# Patient Record
Sex: Female | Born: 1975 | Race: White | Hispanic: No | Marital: Married | Smoking: Current every day smoker
Health system: Southern US, Community
[De-identification: ages and names within clinical notes are randomized; demographics above are authoritative.]

## PROBLEM LIST (undated history)

## (undated) DIAGNOSIS — F319 Bipolar disorder, unspecified: Secondary | ICD-10-CM

## (undated) DIAGNOSIS — K219 Gastro-esophageal reflux disease without esophagitis: Secondary | ICD-10-CM

## (undated) DIAGNOSIS — I5042 Chronic combined systolic (congestive) and diastolic (congestive) heart failure: Secondary | ICD-10-CM

## (undated) DIAGNOSIS — F1120 Opioid dependence, uncomplicated: Secondary | ICD-10-CM

## (undated) DIAGNOSIS — G894 Chronic pain syndrome: Secondary | ICD-10-CM

## (undated) DIAGNOSIS — I236 Thrombosis of atrium, auricular appendage, and ventricle as current complications following acute myocardial infarction: Secondary | ICD-10-CM

## (undated) DIAGNOSIS — B182 Chronic viral hepatitis C: Secondary | ICD-10-CM

## (undated) DIAGNOSIS — I10 Essential (primary) hypertension: Secondary | ICD-10-CM

## (undated) HISTORY — PX: UPPER GI ENDOSCOPY: SHX6162

---

## 2018-08-06 DIAGNOSIS — K5090 Crohn's disease, unspecified, without complications: Secondary | ICD-10-CM

## 2018-08-06 HISTORY — DX: Crohn's disease, unspecified, without complications: K50.90

## 2018-10-06 HISTORY — PX: COLONOSCOPY: SHX174

## 2019-04-06 HISTORY — PX: LAPAROSCOPIC RIGHT COLECTOMY: SHX5925

## 2019-08-31 ENCOUNTER — Other Ambulatory Visit: Payer: Self-pay

## 2019-08-31 ENCOUNTER — Encounter (HOSPITAL_COMMUNITY): Payer: Self-pay | Admitting: Internal Medicine

## 2019-08-31 ENCOUNTER — Inpatient Hospital Stay (HOSPITAL_COMMUNITY): Payer: BC Managed Care – PPO

## 2019-08-31 ENCOUNTER — Inpatient Hospital Stay (HOSPITAL_COMMUNITY)
Admission: AD | Admit: 2019-08-31 | Discharge: 2019-09-02 | DRG: 386 | Disposition: A | Discharge status: Home / Self Care | Payer: BC Managed Care – PPO | Source: Other Acute Inpatient Hospital | Attending: Internal Medicine | Admitting: Internal Medicine

## 2019-08-31 DIAGNOSIS — I5042 Chronic combined systolic (congestive) and diastolic (congestive) heart failure: Secondary | ICD-10-CM | POA: Diagnosis present

## 2019-08-31 DIAGNOSIS — K56609 Unspecified intestinal obstruction, unspecified as to partial versus complete obstruction: Secondary | ICD-10-CM | POA: Diagnosis present

## 2019-08-31 DIAGNOSIS — I110 Hypertensive heart disease with heart failure: Secondary | ICD-10-CM | POA: Diagnosis present

## 2019-08-31 DIAGNOSIS — F1120 Opioid dependence, uncomplicated: Secondary | ICD-10-CM | POA: Diagnosis present

## 2019-08-31 DIAGNOSIS — I252 Old myocardial infarction: Secondary | ICD-10-CM

## 2019-08-31 DIAGNOSIS — Z20828 Contact with and (suspected) exposure to other viral communicable diseases: Secondary | ICD-10-CM | POA: Diagnosis present

## 2019-08-31 DIAGNOSIS — Z800 Family history of malignant neoplasm of digestive organs: Secondary | ICD-10-CM

## 2019-08-31 DIAGNOSIS — D250 Submucous leiomyoma of uterus: Secondary | ICD-10-CM | POA: Diagnosis present

## 2019-08-31 DIAGNOSIS — K50812 Crohn's disease of both small and large intestine with intestinal obstruction: Principal | ICD-10-CM | POA: Diagnosis present

## 2019-08-31 DIAGNOSIS — Z765 Malingerer [conscious simulation]: Secondary | ICD-10-CM

## 2019-08-31 DIAGNOSIS — B182 Chronic viral hepatitis C: Secondary | ICD-10-CM | POA: Diagnosis present

## 2019-08-31 DIAGNOSIS — I82890 Acute embolism and thrombosis of other specified veins: Secondary | ICD-10-CM | POA: Diagnosis present

## 2019-08-31 DIAGNOSIS — I10 Essential (primary) hypertension: Secondary | ICD-10-CM | POA: Diagnosis not present

## 2019-08-31 DIAGNOSIS — G8929 Other chronic pain: Secondary | ICD-10-CM | POA: Diagnosis present

## 2019-08-31 DIAGNOSIS — I236 Thrombosis of atrium, auricular appendage, and ventricle as current complications following acute myocardial infarction: Secondary | ICD-10-CM | POA: Diagnosis not present

## 2019-08-31 DIAGNOSIS — M545 Low back pain: Secondary | ICD-10-CM | POA: Diagnosis present

## 2019-08-31 DIAGNOSIS — F319 Bipolar disorder, unspecified: Secondary | ICD-10-CM | POA: Diagnosis present

## 2019-08-31 DIAGNOSIS — G894 Chronic pain syndrome: Secondary | ICD-10-CM | POA: Diagnosis not present

## 2019-08-31 DIAGNOSIS — F17210 Nicotine dependence, cigarettes, uncomplicated: Secondary | ICD-10-CM | POA: Diagnosis present

## 2019-08-31 DIAGNOSIS — K5010 Crohn's disease of large intestine without complications: Secondary | ICD-10-CM | POA: Diagnosis present

## 2019-08-31 DIAGNOSIS — K219 Gastro-esophageal reflux disease without esophagitis: Secondary | ICD-10-CM | POA: Diagnosis present

## 2019-08-31 DIAGNOSIS — K50112 Crohn's disease of large intestine with intestinal obstruction: Secondary | ICD-10-CM | POA: Diagnosis not present

## 2019-08-31 DIAGNOSIS — K7689 Other specified diseases of liver: Secondary | ICD-10-CM | POA: Diagnosis present

## 2019-08-31 DIAGNOSIS — Z8719 Personal history of other diseases of the digestive system: Secondary | ICD-10-CM | POA: Diagnosis not present

## 2019-08-31 DIAGNOSIS — D509 Iron deficiency anemia, unspecified: Secondary | ICD-10-CM | POA: Diagnosis present

## 2019-08-31 DIAGNOSIS — Z9049 Acquired absence of other specified parts of digestive tract: Secondary | ICD-10-CM

## 2019-08-31 DIAGNOSIS — Z79899 Other long term (current) drug therapy: Secondary | ICD-10-CM

## 2019-08-31 DIAGNOSIS — D500 Iron deficiency anemia secondary to blood loss (chronic): Secondary | ICD-10-CM

## 2019-08-31 DIAGNOSIS — K50819 Crohn's disease of both small and large intestine with unspecified complications: Secondary | ICD-10-CM | POA: Diagnosis not present

## 2019-08-31 DIAGNOSIS — K56600 Partial intestinal obstruction, unspecified as to cause: Secondary | ICD-10-CM | POA: Diagnosis not present

## 2019-08-31 HISTORY — DX: Chronic viral hepatitis C: B18.2

## 2019-08-31 HISTORY — DX: Chronic combined systolic (congestive) and diastolic (congestive) heart failure: I50.42

## 2019-08-31 HISTORY — DX: Essential (primary) hypertension: I10

## 2019-08-31 HISTORY — DX: Thrombosis of atrium, auricular appendage, and ventricle as current complications following acute myocardial infarction: I23.6

## 2019-08-31 HISTORY — DX: Gastro-esophageal reflux disease without esophagitis: K21.9

## 2019-08-31 HISTORY — DX: Bipolar disorder, unspecified: F31.9

## 2019-08-31 HISTORY — DX: Chronic pain syndrome: G89.4

## 2019-08-31 HISTORY — DX: Opioid dependence, uncomplicated: F11.20

## 2019-08-31 LAB — CBC WITH DIFFERENTIAL/PLATELET
Abs Immature Granulocytes: 0.01 10*3/uL (ref 0.00–0.07)
Basophils Absolute: 0 10*3/uL (ref 0.0–0.1)
Basophils Relative: 0 %
Eosinophils Absolute: 0 10*3/uL (ref 0.0–0.5)
Eosinophils Relative: 0 %
HCT: 22.3 % — ABNORMAL LOW (ref 36.0–46.0)
Hemoglobin: 6.7 g/dL — CL (ref 12.0–15.0)
Immature Granulocytes: 0 %
Lymphocytes Relative: 39 %
Lymphs Abs: 1.9 10*3/uL (ref 0.7–4.0)
MCH: 22.9 pg — ABNORMAL LOW (ref 26.0–34.0)
MCHC: 30 g/dL (ref 30.0–36.0)
MCV: 76.1 fL — ABNORMAL LOW (ref 80.0–100.0)
Monocytes Absolute: 0.4 10*3/uL (ref 0.1–1.0)
Monocytes Relative: 9 %
Neutro Abs: 2.4 10*3/uL (ref 1.7–7.7)
Neutrophils Relative %: 52 %
Platelets: 311 10*3/uL (ref 150–400)
RBC: 2.93 MIL/uL — ABNORMAL LOW (ref 3.87–5.11)
RDW: 20.4 % — ABNORMAL HIGH (ref 11.5–15.5)
WBC: 4.8 10*3/uL (ref 4.0–10.5)
nRBC: 0 % (ref 0.0–0.2)

## 2019-08-31 LAB — ABO/RH: ABO/RH(D): A POS

## 2019-08-31 LAB — COMPREHENSIVE METABOLIC PANEL
ALT: 11 U/L (ref 0–44)
AST: 21 U/L (ref 15–41)
Albumin: 1.3 g/dL — ABNORMAL LOW (ref 3.5–5.0)
Alkaline Phosphatase: 103 U/L (ref 38–126)
Anion gap: 7 (ref 5–15)
BUN: 9 mg/dL (ref 6–20)
CO2: 21 mmol/L — ABNORMAL LOW (ref 22–32)
Calcium: 7.7 mg/dL — ABNORMAL LOW (ref 8.9–10.3)
Chloride: 111 mmol/L (ref 98–111)
Creatinine, Ser: 0.79 mg/dL (ref 0.44–1.00)
GFR calc Af Amer: >60 mL/min (ref >60)
GFR calc non Af Amer: >60 mL/min (ref >60)
Glucose, Bld: 99 mg/dL (ref 70–99)
Potassium: 3.6 mmol/L (ref 3.5–5.1)
Sodium: 139 mmol/L (ref 135–145)
Total Bilirubin: 0.4 mg/dL (ref 0.3–1.2)
Total Protein: 4.2 g/dL — ABNORMAL LOW (ref 6.5–8.1)

## 2019-08-31 LAB — HEMOGLOBIN AND HEMATOCRIT, BLOOD
HCT: 25.8 % — ABNORMAL LOW (ref 36.0–46.0)
Hemoglobin: 7.9 g/dL — ABNORMAL LOW (ref 12.0–15.0)

## 2019-08-31 LAB — PREPARE RBC (CROSSMATCH)

## 2019-08-31 LAB — RAPID URINE DRUG SCREEN, HOSP PERFORMED
Amphetamines: NOT DETECTED
Barbiturates: NOT DETECTED
Benzodiazepines: POSITIVE — AB
Cocaine: NOT DETECTED
Opiates: POSITIVE — AB
Tetrahydrocannabinol: NOT DETECTED

## 2019-08-31 LAB — HIV ANTIBODY (ROUTINE TESTING W REFLEX): HIV Screen 4th Generation wRfx: NONREACTIVE

## 2019-08-31 LAB — PROTIME-INR
INR: 1.4 — ABNORMAL HIGH (ref 0.8–1.2)
Prothrombin Time: 16.7 seconds — ABNORMAL HIGH (ref 11.4–15.2)

## 2019-08-31 LAB — SARS CORONAVIRUS 2 (TAT 6-24 HRS): SARS Coronavirus 2: NEGATIVE

## 2019-08-31 LAB — PREGNANCY, URINE: Preg Test, Ur: NEGATIVE

## 2019-08-31 MED ORDER — ONDANSETRON HCL 4 MG/2ML IJ SOLN: 4.0000 mg | Freq: Four times a day (QID) | INTRAMUSCULAR | Status: DC | PRN

## 2019-08-31 MED ORDER — GABAPENTIN 300 MG PO CAPS
300.0000 mg | ORAL_CAPSULE | Freq: Two times a day (BID) | ORAL | Status: DC
  Administered 2019-08-31 – 2019-09-02 (×5): 300 mg via ORAL
  Filled 2019-08-31 (×5): qty 1

## 2019-08-31 MED ORDER — ACETAMINOPHEN 325 MG PO TABS: 650.0000 mg | ORAL_TABLET | Freq: Four times a day (QID) | ORAL | Status: DC | PRN

## 2019-08-31 MED ORDER — SODIUM CHLORIDE 0.9% FLUSH: 10.0000 mL | INTRAVENOUS | Status: DC | PRN

## 2019-08-31 MED ORDER — ASPIRIN EC 81 MG PO TBEC
81.0000 mg | DELAYED_RELEASE_TABLET | Freq: Every day | ORAL | Status: DC
  Administered 2019-08-31 – 2019-09-02 (×3): 81 mg via ORAL
  Filled 2019-08-31 (×3): qty 1

## 2019-08-31 MED ORDER — SODIUM CHLORIDE 0.9% IV SOLUTION
Freq: Once | INTRAVENOUS | Status: AC
  Administered 2019-08-31: 16:00:00 via INTRAVENOUS

## 2019-08-31 MED ORDER — ENOXAPARIN SODIUM 40 MG/0.4ML ~~LOC~~ SOLN
40.0000 mg | SUBCUTANEOUS | Status: DC
  Administered 2019-08-31 – 2019-09-01 (×2): 40 mg via SUBCUTANEOUS
  Filled 2019-08-31 (×3): qty 0.4

## 2019-08-31 MED ORDER — ONDANSETRON HCL 4 MG PO TABS: 4.0000 mg | ORAL_TABLET | Freq: Four times a day (QID) | ORAL | Status: DC | PRN

## 2019-08-31 MED ORDER — BACITRACIN-NEOMYCIN-POLYMYXIN 400-5-5000 EX OINT
1.0000 "application " | TOPICAL_OINTMENT | Freq: Two times a day (BID) | CUTANEOUS | Status: DC
  Administered 2019-08-31 – 2019-09-01 (×3): 1 via TOPICAL
  Filled 2019-08-31 (×2): qty 1

## 2019-08-31 MED ORDER — QUETIAPINE FUMARATE 50 MG PO TABS: 50.0000 mg | ORAL_TABLET | Freq: Every day | ORAL | Status: DC

## 2019-08-31 MED ORDER — ACETAMINOPHEN 650 MG RE SUPP: 650.0000 mg | Freq: Four times a day (QID) | RECTAL | Status: DC | PRN

## 2019-08-31 MED ORDER — CARVEDILOL 3.125 MG PO TABS
3.1250 mg | ORAL_TABLET | Freq: Two times a day (BID) | ORAL | Status: DC
  Administered 2019-08-31 – 2019-09-02 (×5): 3.125 mg via ORAL
  Filled 2019-08-31 (×5): qty 1

## 2019-08-31 MED ORDER — MORPHINE SULFATE (PF) 2 MG/ML IV SOLN
2.0000 mg | INTRAVENOUS | Status: DC | PRN
  Administered 2019-08-31 – 2019-09-02 (×21): 2 mg via INTRAVENOUS
  Filled 2019-08-31 (×22): qty 1

## 2019-08-31 MED ORDER — CYCLOBENZAPRINE HCL 5 MG PO TABS
5.0000 mg | ORAL_TABLET | Freq: Two times a day (BID) | ORAL | Status: DC
  Administered 2019-08-31 – 2019-09-02 (×5): 5 mg via ORAL
  Filled 2019-08-31 (×5): qty 1

## 2019-08-31 MED ORDER — METHYLPREDNISOLONE SODIUM SUCC 125 MG IJ SOLR
80.0000 mg | Freq: Every day | INTRAMUSCULAR | Status: DC
  Administered 2019-08-31: 80 mg via INTRAVENOUS
  Filled 2019-08-31: qty 2

## 2019-08-31 MED ORDER — METHYLPREDNISOLONE SODIUM SUCC 125 MG IJ SOLR
60.0000 mg | Freq: Every day | INTRAMUSCULAR | Status: DC
  Administered 2019-09-01 – 2019-09-02 (×2): 60 mg via INTRAVENOUS
  Filled 2019-08-31 (×2): qty 2

## 2019-08-31 MED ORDER — PANTOPRAZOLE SODIUM 40 MG IV SOLR
40.0000 mg | INTRAVENOUS | Status: DC
  Administered 2019-08-31 – 2019-09-02 (×3): 40 mg via INTRAVENOUS
  Filled 2019-08-31 (×3): qty 40

## 2019-08-31 MED ORDER — SODIUM CHLORIDE 0.9% FLUSH
10.0000 mL | Freq: Two times a day (BID) | INTRAVENOUS | Status: DC
  Administered 2019-08-31: 10 mL

## 2019-08-31 MED ORDER — CARVEDILOL 3.125 MG PO TABS: 3.1250 mg | ORAL_TABLET | Freq: Two times a day (BID) | ORAL | Status: DC

## 2019-08-31 MED ORDER — LACTATED RINGERS IV SOLN
INTRAVENOUS | Status: DC
  Administered 2019-08-31 – 2019-09-01 (×3): via INTRAVENOUS

## 2019-08-31 MED ORDER — QUETIAPINE FUMARATE 50 MG PO TABS
50.0000 mg | ORAL_TABLET | Freq: Every day | ORAL | Status: DC
  Administered 2019-08-31 – 2019-09-01 (×2): 50 mg via ORAL
  Filled 2019-08-31 (×2): qty 1

## 2019-08-31 NOTE — Plan of Care (Signed)

## 2019-08-31 NOTE — Consult Note (Addendum)
[facility] Consult: 10:14 AM 08/31/2019  LOS: 0 days    Referring Provider: Dr Lorin Mercy  Primary Care Physician:  Patient, No Pcp Per Primary Gastroenterologist:  Althia Forts.  Followed in [place] in [place] listed include Dr Alfonse Spruce GI; Dr. Stephens November surgery;  Dr. Elsworth Soho hepatology;  Dr. Robley Fries cardiology  Lives with husband; NOK: Husband, daughter, [phone]  Reason for Consultation:  PSBO in pt with Crohn's Dz.     HPI: Christine Mcdaniel is a 43 y.o. female.  PMH bipolar disorder.  Systolic CHF, EF 62%.  Untreated Hepatiti C.  Chronic pain, on Suboxone for history Percocet abuse.  GERD.  Hypertension.  Non-STEMI with LV thrombus, past but not current Coumadin. Crohn's disease dx 08/2018.  Colonoscopy with biopsies 10/2018, colonoscopy 03/2019 and 11/18 2020.  Multiple episodes partial SBO's.  Not on any meds for Crohn's.  Patient says prednisone seemed to make her symptoms worse.  Has not been able to start any other meds including Biologics because of the untreated hep C.  04/2019 right colectomy ileo- transverse colon anastomosis Plans are a foot to begin treatment for hep C but she needs to get vaccinations beforehand and because of inpatient admissions and lower extremity edema she had not been able to get these vaccinations  Underwent colonoscopy 08/24/2019 showing strictured neoterminal ileum, focal ulcer in the left colon, multiple concentric rings stricturing consistent with NSAID use.  Pathology revealed mild, active ileitis. Multiple hospitalizations for partial small bowel obstructions.  Discharged 11/11 after admission with P SBO, pancreatitis. About a week ago developed worsening abdominal pain and her usual 6 or 7 loose stools a day had diminished.  She then developed feculent smelling,  brown/yellow nausea and vomiting. Patient seen at ED in [facility] 11/23 with PSBO, and left the hospital AMA.  CT showed active colitis/enteritis, PSBO, nonocclusive thrombus of splenic vein, abnormal myometrium.  Transvaginal ultrasound showed submucosal fibroid, plan was for outpatient GYN follow-up.  Started on IV heparin for splenic vein thrombosis but left AMA because she felt her pain was not adequately treated.  She never underwent planned ultrasound Doppler studies for evaluation of the thrombus. Returned to [facility] 11/24 with severe abdominal pain and vomiting.  Emesis resolved prior to evaluation in ER.  Repeat CT revealed 9 mm, TSTC, liver lesion in right hepatic lobe, prior ileocolic resection, multiple dilated loops of small bowel with AFL's concerning for PSBO secondary to Crohn's flare.  Hgb 8.5, MCV 77.  Albumin 2.  T bili 0.4.  Alkaline phosphatase 142.  AST/ALT 22/14.  Lipase 19. lactic acid 1.4.  PT/INR 11.9/1.1.   Has not drunk alcohol since she was in her late teens.  No history injection drug use. Family history negative for inflammatory bowel disease.   Past Medical History:  Diagnosis Date  . Bipolar disease, chronic ([place])   . Chronic combined systolic (congestive) and diastolic (congestive) heart failure (HCC)    most recent EF 45% per patient report  . Chronic pain disorder   . Chronic viral hepatitis C ([place])   . Crohn  disease ([place]) 08/2018   resection in 04/2019; multiple episodes of partial SBO; not on controlling meds  . Essential hypertension   . GERD (gastroesophageal reflux disease)   . 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 Opiate dependence ([place])     Past Surgical History:  Procedure Laterality Date  . LAPAROSCOPIC RIGHT COLECTOMY  04/2019   with ileotransverse anastomosis    Prior to Admission medications   Medication Sig Start Date End Date Taking? Authorizing Provider  carvedilol (COREG)  3.125 MG tablet Take 3.125 mg by mouth 2 (two) times daily. 07/28/19  Yes [provider]  cyclobenzaprine (FLEXERIL) 5 MG tablet Take 5 mg by mouth 2 (two) times daily. 08/17/19  Yes [provider]  DEXILANT 60 MG capsule Take 60 mg by mouth daily. 08/01/19  Yes [provider]  gabapentin (NEURONTIN) 300 MG capsule Take 300 mg by mouth 2 (two) times daily. 08/20/19  Yes [provider]  QUEtiapine (SEROQUEL) 50 MG tablet Take 50 mg by mouth at bedtime. 06/20/19  Yes [provider]    Scheduled Meds: . aspirin EC  81 mg Oral Daily  . carvedilol  3.125 mg Oral BID  . cyclobenzaprine  5 mg Oral BID  . enoxaparin (LOVENOX) injection  40 mg Subcutaneous Q24H  . gabapentin  300 mg Oral BID  . methylPREDNISolone (SOLU-MEDROL) injection  80 mg Intravenous Daily  . pantoprazole (PROTONIX) IV  40 mg Intravenous Q24H  . QUEtiapine  50 mg Oral QHS   Infusions: . lactated ringers 100 mL/hr at 08/31/19 0939   PRN Meds: acetaminophen **OR** acetaminophen, morphine injection, ondansetron **OR** ondansetron (ZOFRAN) IV   Allergies as of 08/31/2019  . (No Known Allergies)    Family History  Problem Relation Age of Onset  . Esophageal cancer Mother        with liver mets  . Heart failure Neg Hx   . Crohn's disease Neg Hx     Social History   Socioeconomic History  . Marital status: Married    Spouse name: Not on file  . Number of children: Not on file  . Years of education: Not on file  . Highest education level: Not on file  Occupational History  . Not on file  Social Needs  . Financial resource strain: Not on file  . Food insecurity    Worry: Not on file    Inability: Not on file  . Transportation needs    Medical: Not on file    Non-medical: Not on file  Tobacco Use  . Smoking status: Current Every Day Smoker    Packs/day: 0.50    Types: Cigarettes  . Smokeless tobacco: Never Used  Substance and Sexual Activity  . Alcohol  use: Not Currently    Frequency: Never  . Drug use: Never  . Sexual activity: Not on file  Lifestyle  . Physical activity    Days per week: Not on file    Minutes per session: Not on file  . Stress: Not on file  Relationships  . Social Herbalist on phone: Not on file    Gets together: Not on file    Attends religious service: Not on file    Active member of club or organization: Not on file    Attends meetings of clubs or organizations: Not on file    Relationship status: Not on file  . Intimate partner violence    Fear of  current or ex partner: Not on file    Emotionally abused: Not on file    Physically abused: Not on file    Forced sexual activity: Not on file  Other Topics Concern  . Not on file  Social History Narrative  . Not on file    REVIEW OF SYSTEMS: Constitutional: Feels tired and somewhat weak. 80 pound weight loss in 12 months. ENT:  No nose bleeds Pulm: No cough, no shortness of breath. CV:  No palpitations, no chest pain.  Intermittent LE edema.  GU:  No hematuria, no frequency GI: See HPI. Heme: No unusual or excessive bleeding or bruising. Transfusions: None. Neuro:  No headaches, no peripheral tingling or numbness Derm:  No itching, no rash or sores.  Endocrine:  No sweats or chills.  No polyuria or dysuria Immunization: Not queried. Travel:  None beyond local counties in last few months.    PHYSICAL EXAM: Vital signs in last 24 hours: Vitals:   08/31/19 0603  BP: 114/69  Pulse: 92  Resp: 15  Temp: 98.5 F (36.9 C)  SpO2: 100%   Wt Readings from Last 3 Encounters:  No data found for Wt    General: Pale, pleasant, comfortable, alert WF Head: No facial asymmetry or swelling.  No signs of head trauma. Eyes: No conjunctival pallor.  EOMI.  No scleral icterus. Ears: No hearing deficit. Nose: No congestion or discharge. Mouth: Edentulous.  Upper dentures not removed for exam.  Mucosa moist, pink, clear.  Tongue midline. Neck:  No JVD, no masses, no thyromegaly. Lungs: Clear bilaterally.  No labored breathing or cough. Heart: RRR.  No MRG.  S1, S2 present. Abdomen: Soft.  Minimal if any tenderness.  No masses, HSM, bruits, hernias.  Healed surgical scar..   Rectal: Deferred Musc/Skeltl: No joint redness, swelling or gross deformity. Extremities: Slight, nonpitting swelling in the left foot Neurologic: Oriented x3.  Alert.  No tremors, strength grossly normal but not tested. Skin: Pale.  No telangiectasia, no sores or rashes. Nodes: No cervical adenopathy. Psych: Cooperative, calm, pleasant.  Fluid speech.  Intake/Output from previous day: No intake/output data recorded. Intake/Output this shift: No intake/output data recorded.  LAB RESULTS: No results for input(s): WBC, HGB, HCT, PLT in the last 72 hours. BMET No results found for: NA, K, CL, CO2, GLUCOSE, BUN, CREATININE, CALCIUM LFT No results for input(s): PROT, ALBUMIN, AST, ALT, ALKPHOS, BILITOT, BILIDIR, IBILI in the last 72 hours. PT/INR No results found for: INR, PROTIME Hepatitis Panel No results for input(s): HEPBSAG, HCVAB, HEPAIGM, HEPBIGM in the last 72 hours. C-Diff No components found for: CDIFF Lipase  No results found for: LIPASE  Drugs of Abuse  No results found for: LABOPIA, COCAINSCRNUR, LABBENZ, AMPHETMU, THCU, LABBARB   RADIOLOGY STUDIES: No results found.   IMPRESSION:   *   Uncontrolled ileocolonic Crohn's.  Right colectomy, ileocolic anastomosis 0/4599.  History of PSBOs/SBOs prior to and subsequent to surgery.  Active ileitis on recent colonoscopy of 11/18. Patient not on any maintenance Crohn's meds.  *    Non-occlusive splenic vein thrombosis.  Liver doppler studies ordered. Treated with Coumadin in the past for LV thrombus associated with non-STEMI. Currently on DVT prophylaxis doses of Lovenox.  *    Hepatitis C.  Plans in place to initiate eradication treatment but needs what sounds like hepatitis A and B  vaccination beforehand.  *    Microcytic anemia.  *    Pancreatitis of unclear etiology diagnosed during admission ending 08/17/2019 in  [place].  Lipase 19 and LFTs with the exception of elevated alk phos also normal in [place] yesterday.  *   Sent COVID-19 testing negative.  Just underwent repeat nasal swab specimen collection for repeat Covid test within the last 10 minutes.   PLAN:     *   Switch Solu-Medrol to 60 mg IV/day NPO, but allow ice chips..  IVF, currently on LR at 100/hour.   Patient declines NG tube placement for now but agreeable if she starts vomiting again. Once the splenic vein thrombosis is characterized, will probably need to start anticoagulation, Dr Lorin Mercy will be managing this  *  Going forward she needs to have the hepatitis C treated so that she can start biologic therapy for the Crohn's disease.  *   Labs including anemia profile in the morning.  May benefit from [facility] infusion as it would be best at present to avoid oral iron.     Azucena Freed  08/31/2019, 10:14 AM Phone [phone]

## 2019-08-31 NOTE — H&P (Signed)
History and Physical    Dashia Caldeira SFK:[id number] DOB: March 16, 1976 DOA: 08/31/2019  PCP: Patient, No Pcp Per Consultants:  Lavell Luster; Nickerson-surgery; Rubio-hepatology; Slavowski-cardiology Patient coming from:  Home - lives with husband; NOK: Husband, daughter, [phone]  Chief Complaint: Abdominal pain  HPI: Belenda Alviar is a 43 y.o. female with medical history significant of Crohn's disease presenting with abdominal pain. She reports abdominal pain and intermittent diarrhea alternating with constipation.  Symptoms have been happening for the last maybe 2 months.  She was having severe pain that increased.  She had acute onset of "5 inches" of yellow-brown emesis.  She had a similar presentation when she had an obstruction about 3 months ago and needed surgery.  No further vomiting since arrival at [facility].  She has lost 82 pounds since last year.  She had bowel resection about 3 months ago in [place].  Now she is complaining of severe pain.   She reports:  FFM:[id number] Blood clot in her heart that "dissolved" with Coreg and MI - 'that burned my pancreas" Systolic CHF -EF 25 -> 99% Low back pain 77m spot on her liver that "they're telling me is a cyst", wants it to be tested for cancer GERD,?[facility]  Records reviewed and summarized as follows: -She was diagnosed with Chron's in 11/19 and does not appear to be taking any disease-modifying therapy or steroids; it appears that she took budesonide in the past but stopped taking it because it did not appear to be effective -She has a prior h/o NSTEMI with LV thrombus for which she was supposed to be taking Coumadin, but it appears she stopped this medication.  She was also taking Coreg 3.125 BID but appears to now be taking 6.25 mg daily. -She has a h/o [facility]; she was on Suboxone but has weaned herself off this medication. -Since her diagnosis of Crohn's, she has had multiple  episodes of flares with partial SBO without need for intervention other than R colectomy with primary ileotransverse anastomosis in July 2020. -Her last hospitalization (prior to 11/23, see below) was for pancreatitis and partial SBO with terminal ileitis, with d/c on 11/11. -She had a colonoscopy on 08/24/19 which found a stricture at the neo-terminal ileum; a focal ulcer in the left colon; and multiple concentric rings c/w NSAID use with 2 strictures associated.  Biopsy showed mild active ileitis. -[facility] for partial SBO 2* to Crohn's flare.  CT on 11/23 showed active colitis/enteritis with partial SBO; nonocclusive thrombus in the splenic vein; and an abnormal myometrium.  She was admitted for treatment with GI and Gen Surg consultation, but left AMA that day because she felt her pain was inadequately controlled.  She did have a transvaginal [place] evaluate the myometrial lesion and it appears to be a submucosal fibroid; outpatient GYN f/u is likely appropriate for this issue.  Meanwhile, abdominal UKoreawas scheduled to evaluate the splenic vein thrombosis and she was started on Heparin drip prior to signing out AMA. -[facility].  The emesis resolved prior to evaluation in the ER.  CT showed a low-density 9 mm liver lesion too small to characterize in the R hepatic lobe; prior ileocolic resection; and multiple dilated loops of small bowel with air fluid levels concerning for partial SBO secondary to Crohn's flare.    ED Course:  Transfer from [facility]  per Dr. Maudie Mercury:  w crohns disease, partial SBO on CT, NGT   Wbc 10.1, Hgb 8.5 Bun 10, Creatinine 1.1 Hco3 23 Lactic acid 1.4  Dr. Meyer Russel  in ED   Review of Systems: As per HPI; otherwise review of systems reviewed and negative.   Ambulatory Status:  Ambulates without assistance   Past Medical  History:  Diagnosis Date  . Bipolar disease, chronic ([place])   . Chronic combined systolic (congestive) and diastolic (congestive) heart failure (HCC)    most recent EF 45% per patient report  . Chronic pain disorder   . Chronic viral hepatitis C ([place])   . Crohn disease ([place]) 08/2018   resection in 04/2019; multiple episodes of partial SBO; not on controlling meds  . Essential hypertension   . GERD (gastroesophageal reflux disease)   . 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 Opiate dependence ([place])     Past Surgical History:  Procedure Laterality Date  . LAPAROSCOPIC RIGHT COLECTOMY  04/2019   with ileotransverse anastomosis    Social History   Socioeconomic History  . Marital status: Married    Spouse name: Not on file  . Number of children: Not on file  . Years of education: Not on file  . Highest education level: Not on file  Occupational History  . Not on file  Social Needs  . Financial resource strain: Not on file  . Food insecurity    Worry: Not on file    Inability: Not on file  . Transportation needs    Medical: Not on file    Non-medical: Not on file  Tobacco Use  . Smoking status: Current Every Day Smoker    Packs/day: 0.50    Types: Cigarettes  . Smokeless tobacco: Never Used  Substance and Sexual Activity  . Alcohol use: Not Currently    Frequency: Never  . Drug use: Never  . Sexual activity: Not on file  Lifestyle  . Physical activity    Days per week: Not on file    Minutes per session: Not on file  . Stress: Not on file  Relationships  . Social Herbalist on phone: Not on file    Gets together: Not on file    Attends religious service: Not on file    Active member of club or organization: Not on file    Attends meetings of clubs or organizations: Not on file    Relationship status: Not on file  . Intimate partner violence    Fear of current or ex partner: Not on file    Emotionally abused:  Not on file    Physically abused: Not on file    Forced sexual activity: Not on file  Other Topics Concern  . Not on file  Social History Narrative  . Not on file    Not on File  Family History  Problem Relation Age of Onset  . Esophageal cancer Mother        with liver mets  . Heart failure Neg Hx   . Crohn's disease Neg Hx     Prior to Admission medications   Not on File    Physical Exam: Vitals:   08/31/19 0603  BP: 114/69  Pulse: 92  Resp: 15  Temp: 98.5 F (36.9 C)  TempSrc: Oral  SpO2: 100%     . General:  Appears calm and comfortable and is NAD, but intermittently tearful and complaining of severe  and uncontrolled pain . Eyes:   EOMI, normal lids, iris . ENT:  grossly normal hearing, lips & tongue, mmm; artificial (partial lower) dentition . Neck:  no LAD, masses or thyromegaly . Cardiovascular:  RRR, no m/r/g. No LE edema.  Marland Kitchen Respiratory:   CTA bilaterally with no wheezes/rales/rhonchi.  Normal respiratory effort. . Abdomen:  soft, minimally tender abdomen primarily in midepigastric region, ND, hypoactive BS; redundant skin c/w significant short-term weight loss . Skin:  no rash or induration seen on limited exam . Musculoskeletal:  grossly normal tone BUE/BLE, good ROM, no bony abnormality . Psychiatric:  Blunted yet inappropriately labile mood and affect, speech fluent and appropriate, AOx3 . Neurologic:  CN 2-12 grossly intact, moves all extremities in coordinated fashion, sensation intact    Radiological Exams on Admission: No results found.  EKG: Independently reviewed.  Sinus tachycardia with rate 104; nonspecific ST changes with no evidence of acute ischemia   Labs on Admission: I have personally reviewed the available labs and imaging studies at the time of the admission.  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 (HCC)   GERD (gastroesophageal reflux disease)   Essential hypertension   Crohn disease (HCC)   Chronic pain disorder   Chronic combined systolic (congestive) and diastolic (congestive) heart failure (HCC)   Bipolar disease, chronic (HCC)   Partial small bowel obstruction (HCC)   Crohn's flare -Patient with recent h/o Crohn's, not on chronic controlling medications -Symptoms are consistent with Crohn's flare, but mild -No leukocytosis, WBC 10.1 -Low suspicion for infectious etiology so will not start antibiotics at this time -Will give gentle IVF hydration and patient should be strict NPO to allow bowel rest -Pain control with low-dose morphine (has h/o opiate addiction so will need to monitor this closely) -Solumedrol 80 mg IV daily -Check UDS -Heme pending -GI consult - appears to need controlling medications in order to prevent recurrent need for hospitalization -Will admit to Med Surg for ongoing monitoring and treatment  Partial SBO -Patient with prior h/o colectomy in 7/20 -Has had recurrent episodes of partial SBO with spontaneous resolution -Currently no indication for surgical intervention, will hold off on surgery consult at this time -NPO for bowel rest -NG tube if n/v recurs but currently stable -IVF hydration -Pain control with morphine  Uterine fibroid -Concern for abnormal myometrium at [facility] on 11/23 -GYN consult considered but TVUS showed apparent submucosal fibroid -Suggest outpatient GYN f/u  Possible splenic vein thrombus with h/o LV thrombus -Concern for non-occlusive splenic vein thrombosis while at [facility] -Has h/o LV thrombus for which she was started on Coumadin but appears to no longer be taking (not on med list, INR is 1.1) -Will check  stat liver doppler US -For now, DVT prophylaxis Lovenox but may need to transition to heparin drip if ongoing concern for thrombus  Chronic combined CHF with h/o NSTEMI, HTN -Patient with apparent NSTEMI with resultant chronic combined CHF -This was reported in [facility] records but specifics (date, echo results, intervention, etc) were not included -Since she appears to be compensated at this time and is not complaining of chest pain, further evaluation will be held -For now, plan for outpatient f/u -  Will continue Coreg -Of note, she does not appear to be taking ASA and so will add (although will stop if she needs to transition to Heparin)  Opiate dependence -Patient denies h/o heroin use, but has h/o Hep C and was taking Suboxone -She reports that she stopped taking Suboxone because she doesn't have daily pain and so doesn't want to take a daily medication -Instead, she prefers to take percocet or other opiates as needed for pain -She was recommended to go to pain clinic and preferred not to do this for the same reason as stated above -She has demonstrated a pattern of leaving hospitals due to inadequate pain control - creating the appearance of drug-seeking behavior -Will need judicious use of opiates with transition off opiates as soon as appropriate -Low-dose morphine has been ordered and it would be prudent to avoid escalation of medication if possible  Bipolar -It appears that she is taking Seroquel prn rather than qhs; will change to nightly  GERD -Change Protonix to IV for now -Needs to avoid NSAIDs  Tobacco dependence -Encourage cessation.   -This was discussed with the patient and should be reviewed on an ongoing basis.   -Patch declined by patient.    Note: This patient has not been tested and is currently pending for the novel coronavirus COVID-19.  DVT prophylaxis:  Lovenox Code Status:  Full - confirmed with patient Family Communication: None present Disposition  Plan:  Home once clinically improved Consults called: GI  Admission status: Admit - It is my clinical opinion that admission to INPATIENT is reasonable and necessary because of the expectation that this patient will require hospital care that crosses at least 2 midnights to treat this condition based on the medical complexity of the problems presented.  Given the aforementioned information, the predictability of an adverse outcome is felt to be significant.     Karmen Bongo MD [facility]   How to contact the [facility] Attending or Consulting provider [place] or covering provider during after hours [place], for this patient?  1. Check the care team in [facility] and look for a) attending/consulting TRH provider listed and b) the [facility] team listed 2. Log into [email or web address] and use [place]'s universal password to access. If you do not have the password, please contact the hospital operator. 3. Locate the [facility] provider you are looking for under [facility] and page to a number that you can be directly reached. 4. If you still have difficulty reaching the provider, please page the [facility] (Director on Call) for the Hospitalists listed on amion for assistance.   08/31/2019, 9:07 AM

## 2019-08-31 NOTE — Progress Notes (Signed)
Received to room from [facility] in [place] to room 218 via stretcher. Assisted to bed and positioned for comfort.Oriented to room, bed and unit.

## 2019-08-31 NOTE — Progress Notes (Signed)
CRITICAL VALUE ALERT  Critical Value:  Hgb 6.7  Date & Time Notied:  08/31/2019 at 1203  Provider Notified: Karmen Bongo, MD paged  Orders Received/Actions taken: Awaiting MD response

## 2019-09-01 DIAGNOSIS — F1120 Opioid dependence, uncomplicated: Secondary | ICD-10-CM

## 2019-09-01 DIAGNOSIS — F319 Bipolar disorder, unspecified: Secondary | ICD-10-CM

## 2019-09-01 DIAGNOSIS — I236 Thrombosis of atrium, auricular appendage, and ventricle as current complications following acute myocardial infarction: Secondary | ICD-10-CM

## 2019-09-01 DIAGNOSIS — K50112 Crohn's disease of large intestine with intestinal obstruction: Secondary | ICD-10-CM

## 2019-09-01 DIAGNOSIS — G894 Chronic pain syndrome: Secondary | ICD-10-CM

## 2019-09-01 DIAGNOSIS — I10 Essential (primary) hypertension: Secondary | ICD-10-CM

## 2019-09-01 DIAGNOSIS — I5042 Chronic combined systolic (congestive) and diastolic (congestive) heart failure: Secondary | ICD-10-CM

## 2019-09-01 LAB — CBC
HCT: 25.2 % — ABNORMAL LOW (ref 36.0–46.0)
Hemoglobin: 7.6 g/dL — ABNORMAL LOW (ref 12.0–15.0)
MCH: 23.4 pg — ABNORMAL LOW (ref 26.0–34.0)
MCHC: 30.2 g/dL (ref 30.0–36.0)
MCV: 77.5 fL — ABNORMAL LOW (ref 80.0–100.0)
Platelets: 281 10*3/uL (ref 150–400)
RBC: 3.25 MIL/uL — ABNORMAL LOW (ref 3.87–5.11)
RDW: 19.6 % — ABNORMAL HIGH (ref 11.5–15.5)
WBC: 6.2 10*3/uL (ref 4.0–10.5)
nRBC: 0 % (ref 0.0–0.2)

## 2019-09-01 LAB — TYPE AND SCREEN
ABO/RH(D): A POS
Antibody Screen: NEGATIVE
Unit division: 0

## 2019-09-01 LAB — RETICULOCYTES
Immature Retic Fract: 22.1 % — ABNORMAL HIGH (ref 2.3–15.9)
RBC.: 3.25 MIL/uL — ABNORMAL LOW (ref 3.87–5.11)
Retic Count, Absolute: 67 10*3/uL (ref 19.0–186.0)
Retic Ct Pct: 2.1 % (ref 0.4–3.1)

## 2019-09-01 LAB — IRON AND TIBC
Iron: 14 ug/dL — ABNORMAL LOW (ref 28–170)
Saturation Ratios: 8 % — ABNORMAL LOW (ref 10.4–31.8)
TIBC: 174 ug/dL — ABNORMAL LOW (ref 250–450)
UIBC: 160 ug/dL

## 2019-09-01 LAB — BASIC METABOLIC PANEL
Anion gap: 6 (ref 5–15)
BUN: 6 mg/dL (ref 6–20)
CO2: 25 mmol/L (ref 22–32)
Calcium: 8 mg/dL — ABNORMAL LOW (ref 8.9–10.3)
Chloride: 111 mmol/L (ref 98–111)
Creatinine, Ser: 0.75 mg/dL (ref 0.44–1.00)
GFR calc Af Amer: >60 mL/min (ref >60)
GFR calc non Af Amer: >60 mL/min (ref >60)
Glucose, Bld: 86 mg/dL (ref 70–99)
Potassium: 3.6 mmol/L (ref 3.5–5.1)
Sodium: 142 mmol/L (ref 135–145)

## 2019-09-01 LAB — FERRITIN: Ferritin: 6 ng/mL — ABNORMAL LOW (ref 11–307)

## 2019-09-01 LAB — SEDIMENTATION RATE: Sed Rate: 12 mm/hr (ref 0–22)

## 2019-09-01 LAB — BPAM RBC
Blood Product Expiration Date: 202012172359
ISSUE DATE / TIME: 202011251603
Unit Type and Rh: 6200

## 2019-09-01 LAB — C-REACTIVE PROTEIN: CRP: <0.8 mg/dL (ref <1.0)

## 2019-09-01 LAB — VITAMIN B12: Vitamin B-12: 537 pg/mL (ref 180–914)

## 2019-09-01 LAB — FOLATE: Folate: 10.7 ng/mL (ref >5.9)

## 2019-09-01 MED ORDER — HEPARIN (PORCINE) IN NACL 25000-0.45 UT/500ML-% IV SOLN
18.00 | INTRAVENOUS | Status: DC
Start: ? — End: 2019-09-01

## 2019-09-01 MED ORDER — SODIUM CHLORIDE 0.9 % IV SOLN
510.0000 mg | Freq: Once | INTRAVENOUS | Status: AC
  Administered 2019-09-01: 510 mg via INTRAVENOUS
  Filled 2019-09-01: qty 17

## 2019-09-01 MED ORDER — PANTOPRAZOLE SODIUM 40 MG IV SOLR
40.00 | INTRAVENOUS | Status: DC
Start: 2019-08-29 — End: 2019-09-01

## 2019-09-01 MED ORDER — BACITRACIN-NEOMYCIN-POLYMYXIN OINTMENT TUBE
TOPICAL_OINTMENT | Freq: Two times a day (BID) | CUTANEOUS | Status: DC
  Administered 2019-09-02 (×2): via TOPICAL
  Filled 2019-09-01: qty 14

## 2019-09-01 MED ORDER — ONDANSETRON HCL 4 MG/2ML IJ SOLN
4.00 | INTRAMUSCULAR | Status: DC
Start: ? — End: 2019-09-01

## 2019-09-01 MED ORDER — HEPARIN SODIUM (PORCINE) 5000 UNIT/ML IJ SOLN
80.00 | INTRAMUSCULAR | Status: DC
Start: ? — End: 2019-09-01

## 2019-09-01 MED ORDER — MORPHINE SULFATE (PF) 2 MG/ML IV SOLN
2.00 | INTRAVENOUS | Status: DC
Start: ? — End: 2019-09-01

## 2019-09-01 MED ORDER — CYCLOBENZAPRINE HCL 10 MG PO TABS
5.00 | ORAL_TABLET | ORAL | Status: DC
Start: 2019-08-29 — End: 2019-09-01

## 2019-09-01 MED ORDER — LOSARTAN POTASSIUM 25 MG PO TABS
25.00 | ORAL_TABLET | ORAL | Status: DC
Start: 2019-08-30 — End: 2019-09-01

## 2019-09-01 MED ORDER — CARVEDILOL 3.125 MG PO TABS
3.13 | ORAL_TABLET | ORAL | Status: DC
Start: 2019-08-29 — End: 2019-09-01

## 2019-09-01 MED ORDER — LACTATED RINGERS IV SOLN
75.00 | INTRAVENOUS | Status: DC
Start: ? — End: 2019-09-01

## 2019-09-01 MED ORDER — BUDESONIDE 3 MG PO CPEP
9.00 | ORAL_CAPSULE | ORAL | Status: DC
Start: 2019-08-30 — End: 2019-09-01

## 2019-09-01 MED ORDER — QUETIAPINE FUMARATE 25 MG PO TABS
50.00 | ORAL_TABLET | ORAL | Status: DC
Start: ? — End: 2019-09-01

## 2019-09-01 MED ORDER — DOCUSATE SODIUM 100 MG PO CAPS
100.00 | ORAL_CAPSULE | ORAL | Status: DC
Start: ? — End: 2019-09-01

## 2019-09-01 MED ORDER — GABAPENTIN 300 MG PO CAPS
300.00 | ORAL_CAPSULE | ORAL | Status: DC
Start: ? — End: 2019-09-01

## 2019-09-01 MED ORDER — HEPARIN SODIUM (PORCINE) 5000 UNIT/ML IJ SOLN
40.00 | INTRAMUSCULAR | Status: DC
Start: ? — End: 2019-09-01

## 2019-09-01 MED ORDER — CLONIDINE HCL 0.1 MG PO TABS
0.10 | ORAL_TABLET | ORAL | Status: DC
Start: 2019-08-29 — End: 2019-09-01

## 2019-09-01 NOTE — Progress Notes (Signed)
PROGRESS NOTE    Christine Mcdaniel  YPP:[id number] DOB: December 21, 1975 DOA: 08/31/2019 PCP: Patient, No Pcp Per   Brief Narrative:  43 year old female with a history of Crohn's, presented with abdominal pain.  Patient admitted with partial SBO and Crohn's flare.  GI consulted.  Currently on IV steroids and IV fluids. Assessment & Plan   Crohn's flare -Patient with history of Crohn's disease but not on any chronic controlling medications.  She also has a history of hepatitis C which complicates the ability to control her Crohn's. -Continue IV Solu-Medrol, IV fluids and pain control  Partial small bowel obstruction -History of colectomy 7/20 -Has had recurrent episodes of partial SBO's with spontaneous resolution in the past -Continue bowel rest, n.p.o. -Patient refuses NG tube -Continue IV fluids, pain control  Chronic Microcytic Anemia  -On admission, hemoglobin was 6.7, given 1 unit PRBC -Currently hemoglobin 7.6 -Anemia panel showed an iron of 14, ferritin of 6 -Will give dose of Feraheme -Monitor CBC  Uterine fibroid -Concern for abnormal myometrium at [facility] on 08/29/2019 -Transvaginal ultrasound showed apparent submucosal fibroid -Suggest gynecological follow-up on discharge  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 -Concern for nonocclusive splenic vein with thrombus while at outside facility [facility] -Patient with history of LV thrombus for which she was started on Coumadin but appears to no longer be taking this medication as her INR is 1.1 -Liver Doppler ultrasound is unremarkable  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 -This was reported in [facility] records -Currently she appears to be compensated and not complaining of chest pain -Continue Coreg and aspirin -Patient will need to follow-up as an outpatient with cardiology  Opioid dependence -Denies history of heroin use but does have a history of hepatitis C -Admits  to taking Suboxone however recently discontinued due to daily pain -Feels that morphine is not helping control her pain she has a very high tolerance -It was recommended the patient go to the pain clinic however preferred not to do this -Patient prefers Percocet or other opiates as needed for pain -She has demonstrated pattern of leaving hospital soon inadequate pain control -Will continue judicious use of opiates and transition off of IV as able to  Bipolar disorder -Continue Seroquel  GERD -Continue PPI  Tobacco dependence -Discussed cessation, patch declined by patient  DVT Prophylaxis Lovenox  Code Status: Full  Family Communication: None at bedside  Disposition Plan: Admitted.  Pending further GI recommendations  Consultants Gastroenterology  Procedures  None  Antibiotics   Anti-infectives (From admission, onward)   None      Subjective:   Christine Mcdaniel seen and examined today.  States that morphine only controls her pain for approximately 1 hour.  States she has chronic back pain and now this abdominal pain which has mildly improved since admission.  Admits to feeling very hungry this morning and would like to eat.  Denies any current nausea or vomiting-has not had any episodes since being in [place] ER.  Denies current chest pain, shortness of breath, dizziness or headache. Objective:   Vitals:   08/31/19 1628 08/31/19 1843 08/31/19 2049 09/01/19 0544  BP: 114/74 111/64 113/69 109/76  Pulse: 73 72 72 77  Resp: 18 17 15 16   Temp: 98.2 F (36.8 C) 98.2 F (36.8 C) 98.2 F (36.8 C) 98.1 F (36.7 C)  TempSrc: Oral Oral Oral Oral  SpO2: 99% 100% 98% 100%    Intake/Output Summary (Last 24 hours) at 09/01/2019 1023 Last data filed  at 08/31/2019 1900 Gross per 24 hour  Intake 325 ml  Output 300 ml  Net 25 ml   There were no vitals filed for this visit.  Exam  General: Well developed, well nourished, NAD, appears stated age  5: NCAT, mucous  membranes moist.   Cardiovascular: S1 S2 auscultated, RRR, no murmur  Respiratory: Clear to auscultation bilaterally with equal chest rise  Abdomen: Soft, nontender, nondistended, well-healed surgical scar  Extremities: warm dry without cyanosis clubbing or edema  Neuro: AAOx3, nonfocal  Psych: Appropriate mood and affect   Data Reviewed: I have personally reviewed following labs and imaging studies  CBC: Recent Labs  Lab 08/31/19 1106 08/31/19 2040 09/01/19 0432  WBC 4.8  --  6.2  NEUTROABS 2.4  --   --   HGB 6.7* 7.9* 7.6*  HCT 22.3* 25.8* 25.2*  MCV 76.1*  --  77.5*  PLT 311  --  300   Basic Metabolic Panel: Recent Labs  Lab 08/31/19 1106 09/01/19 0432  NA 139 142  K 3.6 3.6  CL 111 111  CO2 21* 25  GLUCOSE 99 86  BUN 9 6  CREATININE 0.79 0.75  CALCIUM 7.7* 8.0*   GFR: CrCl cannot be calculated (Unknown ideal weight.). Liver Function Tests: Recent Labs  Lab 08/31/19 1106  AST 21  ALT 11  ALKPHOS 103  BILITOT 0.4  PROT 4.2*  ALBUMIN 1.3*   No results for input(s): LIPASE, AMYLASE in the last 168 hours. No results for input(s): AMMONIA in the last 168 hours. Coagulation Profile: Recent Labs  Lab 08/31/19 1106  INR 1.4*   Cardiac Enzymes: No results for input(s): CKTOTAL, CKMB, CKMBINDEX, TROPONINI in the last 168 hours. BNP (last 3 results) No results for input(s): PROBNP in the last 8760 hours. HbA1C: No results for input(s): HGBA1C in the last 72 hours. CBG: No results for input(s): GLUCAP in the last 168 hours. Lipid Profile: No results for input(s): CHOL, HDL, LDLCALC, TRIG, CHOLHDL, LDLDIRECT in the last 72 hours. Thyroid Function Tests: No results for input(s): TSH, T4TOTAL, FREET4, T3FREE, THYROIDAB in the last 72 hours. Anemia Panel: Recent Labs    09/01/19 0432  VITAMINB12 537  FOLATE 10.7  FERRITIN 6*  TIBC 174*  IRON 14*  RETICCTPCT 2.1   Urine analysis: No results found for: COLORURINE, APPEARANCEUR, LABSPEC,  PHURINE, GLUCOSEU, HGBUR, BILIRUBINUR, KETONESUR, PROTEINUR, UROBILINOGEN, NITRITE, LEUKOCYTESUR Sepsis Labs: @LABRCNTIP (procalcitonin:4,lacticidven:4)  ) Recent Results (from the past 240 hour(s))  SARS CORONAVIRUS 2 (TAT 6-24 HRS) Nasopharyngeal Nasopharyngeal Swab     Status: None   Collection Time: 08/31/19 10:43 AM   Specimen: Nasopharyngeal Swab  Result Value Ref Range Status   SARS Coronavirus 2 NEGATIVE NEGATIVE Final    Comment: (NOTE) SARS-CoV-2 target nucleic acids are NOT DETECTED. The SARS-CoV-2 RNA is generally detectable in upper and lower respiratory specimens during the acute phase of infection. Negative results do not preclude SARS-CoV-2 infection, do not rule out co-infections with other pathogens, and should not be used as the sole basis for treatment or other patient management decisions. Negative results must be combined with clinical observations, patient history, and epidemiological information. The expected result is Negative. Fact Sheet for Patients: SugarRoll.be Fact Sheet for Healthcare Providers: https://www.woods-mathews.com/ This test is not yet approved or cleared by the [place] FDA and  has been authorized for detection and/or diagnosis of SARS-CoV-2 by FDA under an Emergency Use Authorization (EUA). This EUA will remain  in effect (meaning this test can be used) for  the duration of the COVID-19 declaration under Section 56 4(b)(1) of the Act, 21 U.S.C. section 360bbb-3(b)(1), unless the authorization is terminated or revoked sooner. Performed at [facility], [place] [address] [place], [place] [address]       Radiology Studies: US Liver Doppler  Result Date: 08/31/2019 CLINICAL DATA:  Crohn's colitis. EXAM: DUPLEX ULTRASOUND OF LIVER TECHNIQUE: Color and duplex Doppler ultrasound was performed to evaluate the hepatic in-flow and out-flow vessels. COMPARISON:  None. FINDINGS: Liver: Normal  parenchymal echogenicity. Normal hepatic contour without nodularity. No focal lesion, mass or intrahepatic biliary ductal dilatation. Main Portal Vein size: 1.5 cm Portal Vein Velocities Main Prox:  31.4 cm/sec Main Mid: 38.1 cm/sec Main Dist:  33.9 cm/sec Right: 41.6 cm/sec Left: 24.3 cm/sec Normal hepatopetal flow is noted in the portal veins. Hepatic Vein Velocities Right:  62.3 cm/sec Middle:  31.9 cm/sec Left:  63.3 cm/sec Normal hepatofugal flow is noted in the hepatic veins. IVC: Present and patent with normal respiratory phasicity. Hepatic Artery Velocity:  61.9 cm/sec Splenic Vein Velocity:  43.1 cm/sec Spleen: 6.2 cm x 9.9 cm x 5.1 cm with a total volume of 166 cm^3 (411 cm^3 is upper limit normal) Portal Vein Occlusion/Thrombus: No Splenic Vein Occlusion/Thrombus: No Ascites: None Varices: None IMPRESSION: No Doppler evidence of portal, hepatic or splenic venous thrombosis or occlusion. Electronically Signed   By: Marijo Conception M.D.   On: 08/31/2019 12:45     Scheduled Meds:  aspirin EC  81 mg Oral Daily   carvedilol  3.125 mg Oral BID   cyclobenzaprine  5 mg Oral BID   enoxaparin (LOVENOX) injection  40 mg Subcutaneous Q24H   gabapentin  300 mg Oral BID   methylPREDNISolone (SOLU-MEDROL) injection  60 mg Intravenous Daily   neomycin-bacitracin-polymyxin  1 application Topical BID   pantoprazole (PROTONIX) IV  40 mg Intravenous Q24H   QUEtiapine  50 mg Oral QHS   sodium chloride flush  10-40 mL Intracatheter Q12H   Continuous Infusions:  lactated ringers 100 mL/hr at 08/31/19 1854     LOS: 1 day   Time Spent in minutes   30 minutes  Adekunle Rohrbach D.O. on 09/01/2019 at 10:23 AM  Between 7am to 7pm - Please see pager noted on [email or web address]  After 7pm go to [email or web address]  And look for the night coverage person covering for me after hours  [facility] Office  [phone]

## 2019-09-01 NOTE — Progress Notes (Signed)
CROSS COVER [place] [facility] UNASSIGNED PATIENT Subjective: Christine Mcdaniel is at a 43 year old white female who has a history of Crohn's disease complicated by stricturing disease at the anastomosis after she had a right hemicolectomy with ileocolonic anastomosis. She also has hepatitis C which is complicating her treatment plans.She was admitted to the hospital with partial small bowel obstruction nausea and vomiting. She refused an NG tube to for decompression. She is feeling better today and is passing flatus she is anxious about not having adequate pain control even though she is getting morphine every 2 hours. A clear liquid diet for was ordered for her today which she is excited about. She gets all her care from GI/hepatologist at [facility], where she will return after her discharge from the hospital this time.  The only complaint the patient has today is lack of adequate pain control and the need for more frequent narcotics.  She is passing flatus but has not had a bowel movement today  Objective: Vital signs in last 24 hours: Temp:  [98 F (36.7 C)-98.7 F (37.1 C)] 98.1 F (36.7 C) (11/26 0544) Pulse Rate:  [72-77] 77 (11/26 0544) Resp:  [15-18] 16 (11/26 0544) BP: (105-114)/(64-76) 109/76 (11/26 0544) SpO2:  [98 %-100 %] 100 % (11/26 0544)   Intake/Output from previous day: 11/25 0701 - 11/26 0700 In: 325 [I.V.:10; Blood:315] Out: 300 [Urine:300] Intake/Output this shift: No intake/output data recorded.  General appearance: alert, cooperative, appears stated age, in NO acute distress and pale Resp: clear to auscultation bilaterally Cardio: regular rate and rhythm, S1, S2 normal, no murmur, click, rub or gallop GI: soft, non-tender; bowel sounds hypoactive; no masses,  no organomegaly Extremities: extremities normal, atraumatic, no cyanosis or edema  Lab Results: Recent Labs    08/31/19 1106 08/31/19 2040 09/01/19 0432  WBC 4.8  --  6.2  HGB 6.7* 7.9* 7.6*  HCT  22.3* 25.8* 25.2*  PLT 311  --  281   BMET Recent Labs    08/31/19 1106 09/01/19 0432  NA 139 142  K 3.6 3.6  CL 111 111  CO2 21* 25  GLUCOSE 99 86  BUN 9 6  CREATININE 0.79 0.75  CALCIUM 7.7* 8.0*   LFT Recent Labs    08/31/19 1106  PROT 4.2*  ALBUMIN 1.3*  AST 21  ALT 11  ALKPHOS 103  BILITOT 0.4   PT/INR Recent Labs    08/31/19 1106  LABPROT 16.7*  INR 1.4*    Studies/Results: US Liver Doppler  Result Date: 08/31/2019 CLINICAL DATA:  Crohn's colitis. EXAM: DUPLEX ULTRASOUND OF LIVER TECHNIQUE: Color and duplex Doppler ultrasound was performed to evaluate the hepatic in-flow and out-flow vessels. COMPARISON:  None. FINDINGS: Liver: Normal parenchymal echogenicity. Normal hepatic contour without nodularity. No focal lesion, mass or intrahepatic biliary ductal dilatation. Main Portal Vein size: 1.5 cm Portal Vein Velocities Main Prox:  31.4 cm/sec Main Mid: 38.1 cm/sec Main Dist:  33.9 cm/sec Right: 41.6 cm/sec Left: 24.3 cm/sec Normal hepatopetal flow is noted in the portal veins. Hepatic Vein Velocities Right:  62.3 cm/sec Middle:  31.9 cm/sec Left:  63.3 cm/sec Normal hepatofugal flow is noted in the hepatic veins. IVC: Present and patent with normal respiratory phasicity. Hepatic Artery Velocity:  61.9 cm/sec Splenic Vein Velocity:  43.1 cm/sec Spleen: 6.2 cm x 9.9 cm x 5.1 cm with a total volume of 166 cm^3 (411 cm^3 is upper limit normal) Portal Vein Occlusion/Thrombus: No Splenic Vein Occlusion/Thrombus: No Ascites: None Varices: None IMPRESSION: No  Doppler evidence of portal, hepatic or splenic venous thrombosis or occlusion. Electronically Signed   By: Marijo Conception M.D.   On: 08/31/2019 12:45   Medications: I have reviewed the patient's current medications.  Assessment/Plan: 1) Recurrent small bowel obstruction with stricturing Crohn's disease. Patient is status post right hemicolectomy with a stricture at the anastomosis and has had recurrent bouts of small  bowel obstruction.  Her history is complicated by the fact that she has Hepatitis C and definite treatment plans with regards to hepatitis have not been established yet. She would benefit from biologics.  This decision will be deferred to her gastroenterologist/hepatologist in [place].  As per my discussion with Dr. Bjorn Pippin there was some concern that the patient would leave AMA.  I have advised the patient the need for her to get some bowel rest and IV fluids and not to make rational decisions regarding her discharge at this time.  It is interesting that the patient's CRP is less than 0.8. Patient is not on any treatment for her Crohn's disease at this time. There is a history of having " an adverse reaction to prednisone". 2) Severe iron deficiency anemia.  There was 14 with a Ferritin of 6 and a hemoglobin of 7.6. 3) Systolic congestive heart failure with an EF of 45% history of non-STEMI with a less left ventricular thrombus. 4) Hypertension. 5) History of opiod abuse. On Suboxone for history of Percocet abuse.  Patient is insisting that she is not getting of enough pain medications I will discuss this with Dr. Ree Kida and the pharmacy to see what else we can do to achieve better pain control.  LOS: 1 day   Juanita Craver 09/01/2019, 7:35 AM

## 2019-09-01 NOTE — Plan of Care (Signed)
  Problem: Education: Goal: Knowledge of General Education information will improve Description: Including pain rating scale, medication(s)/side effects and non-pharmacologic comfort measures Outcome: Progressing   Problem: Health Behavior/Discharge Planning: Goal: Ability to manage health-related needs will improve Outcome: Progressing   Problem: Clinical Measurements: Goal: Ability to maintain clinical measurements within normal limits will improve Outcome: Progressing Goal: Will remain free from infection Outcome: Progressing Goal: Respiratory complications will improve Outcome: Progressing Goal: Cardiovascular complication will be avoided Outcome: Progressing   Problem: Activity: Goal: Risk for activity intolerance will decrease Outcome: Progressing   Problem: Coping: Goal: Level of anxiety will decrease Outcome: Progressing   Problem: Elimination: Goal: Will not experience complications related to urinary retention Outcome: Progressing   Problem: Pain Managment: Goal: General experience of comfort will improve Outcome: Progressing   Problem: Safety: Goal: Ability to remain free from injury will improve Outcome: Progressing   Problem: Skin Integrity: Goal: Risk for impaired skin integrity will decrease Outcome: Progressing

## 2019-09-02 DIAGNOSIS — K219 Gastro-esophageal reflux disease without esophagitis: Secondary | ICD-10-CM

## 2019-09-02 DIAGNOSIS — K50819 Crohn's disease of both small and large intestine with unspecified complications: Secondary | ICD-10-CM

## 2019-09-02 DIAGNOSIS — D500 Iron deficiency anemia secondary to blood loss (chronic): Secondary | ICD-10-CM

## 2019-09-02 LAB — HEMOGLOBIN AND HEMATOCRIT, BLOOD
HCT: 28 % — ABNORMAL LOW (ref 36.0–46.0)
Hemoglobin: 8.5 g/dL — ABNORMAL LOW (ref 12.0–15.0)

## 2019-09-02 MED ORDER — ASPIRIN 81 MG PO TBEC: 81.0000 mg | DELAYED_RELEASE_TABLET | Freq: Every day | ORAL | Status: AC

## 2019-09-02 MED ORDER — BOOST / RESOURCE BREEZE PO LIQD CUSTOM
1.0000 | Freq: Three times a day (TID) | ORAL | Status: DC
  Administered 2019-09-02: 1 via ORAL

## 2019-09-02 MED ORDER — HYDROCODONE-ACETAMINOPHEN 5-325 MG PO TABS: 1.0000 | ORAL_TABLET | Freq: Four times a day (QID) | ORAL | 0 refills | Status: AC | PRN

## 2019-09-02 MED ORDER — HYDROCODONE-ACETAMINOPHEN 5-325 MG PO TABS
1.0000 | ORAL_TABLET | ORAL | Status: DC | PRN
  Administered 2019-09-02: 1 via ORAL
  Filled 2019-09-02: qty 1

## 2019-09-02 MED ORDER — ADULT MULTIVITAMIN W/MINERALS CH
1.0000 | ORAL_TABLET | Freq: Every day | ORAL | Status: DC
  Administered 2019-09-02: 1 via ORAL
  Filled 2019-09-02: qty 1

## 2019-09-02 MED ORDER — ADULT MULTIVITAMIN W/MINERALS CH: 1.0000 | ORAL_TABLET | Freq: Every day | ORAL | Status: AC

## 2019-09-02 NOTE — Progress Notes (Signed)
Christine Mcdaniel to be discharged Home per MD order. Discussed prescriptions and follow up appointments with the patient. Prescriptions explained to patient; medication list explained in detail. Patient verbalized understanding.  Skin clean, dry and intact without evidence of skin break down, no evidence of skin tears noted. IV catheter discontinued intact. Site without signs and symptoms of complications. Dressing and pressure applied. Pt denies pain at the site currently. No complaints noted.  Patient free of lines, drains, and wounds.   An After Visit Summary (AVS) was printed and given to the patient. Patient escorted via wheelchair, and discharged home via private auto.  [name], RN

## 2019-09-02 NOTE — Discharge Summary (Signed)
Physician Discharge Summary  Cailah Reach QJJ:[id number] DOB: 08/14/1976 DOA: 08/31/2019  PCP: Patient, No Pcp Per  Admit date: 08/31/2019 Discharge date: 09/02/2019  Time spent: 45 minutes  Recommendations for Outpatient Follow-up:  Patient will be discharged to home.  Patient will need to follow up with primary care provider within one week of discharge, repeat CBC.  Follow up with gastroenterology. Patient should continue medications as prescribed.  Patient should follow a soft diet.   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  History of present illness:  On 08/31/2019 by Dr. Karmen Bongo Imogene Gravelle is a 43 y.o. female with medical history significant of Crohn's disease presenting with abdominal pain. She reports abdominal pain and intermittent diarrhea alternating with constipation.  Symptoms have been happening for the last maybe 2 months.  She was having severe pain that increased.  She had acute onset of "5 inches" of yellow-brown emesis.  She had a similar presentation when she had an obstruction about 3 months ago and needed surgery.  No further vomiting since arrival at [facility].  She has lost 82 pounds since last year.  She had bowel resection about 3 months ago in [place].  Now she is complaining of severe pain.   She reports:  GYJ:EHUDJ'S Blood clot in her heart that "dissolved" with Coreg and MI - 'that burned my pancreas" Systolic CHF -EF 25 -> 97% Low back pain 79m spot on her liver that "they're telling me is a cyst", wants it to be tested for cancer GERD,?[facility]'s  Records reviewed and summarized as  follows: -She was diagnosed with Chron's in 11/19 and does not appear to be taking any disease-modifying therapy or steroids; it appears that she took budesonide in the past but stopped taking it because it did not appear to be effective -She has a prior h/o NSTEMI with LV thrombus for which she was supposed to be taking Coumadin, but it appears she stopped this medication.  She was also taking Coreg 3.125 BID but appears to now be taking 6.25 mg daily. -She has [facility] she was on Suboxone but has weaned herself off this medication. -Since her diagnosis of Crohn's, she has had multiple episodes of flares with partial SBO without need for intervention other than R colectomy with primary ileotransverse anastomosis in July 2020. -Her last hospitalization (prior to 11/23, see below) was for pancreatitis and partial SBO with terminal ileitis, with d/c on 11/11. -She had a colonoscopy on 08/24/19 which found a stricture at the neo-terminal ileum; a focal ulcer in the left colon; and multiple concentric rings c/w NSAID use with 2 strictures associated.  Biopsy showed mild active ileitis. -[facility] was [facility] for partial SBO 2* to Crohn's flare.  CT on 11/23 showed active colitis/enteritis with partial SBO; nonocclusive thrombus in the splenic vein; and an abnormal myometrium.  She was admitted for treatment with GI and Gen Surg consultation, but left AMA that day because she felt her pain was inadequately controlled.  She did have a transvaginal [place]ato evaluate the myometrial lesion and it appears to be a submucosal fibroid; outpatient GYN f/u  is likely appropriate for this issue.  Meanwhile, abdominal US was scheduled to evaluate the splenic vein thrombosis and she was started on Heparin drip prior to signing out AMA. -She returned to [facility] yesterday due to recurrent severe abdominal pain and recurrent  vomiting.  The emesis resolved prior to evaluation in the ER.  CT showed a low-density 9 mm liver lesion too small to characterize in the R hepatic lobe; prior ileocolic resection; and multiple dilated loops of small bowel with air fluid levels concerning for partial SBO secondary to Crohn's flare.  Hospital Course:  Crohn's flare -Patient with history of Crohn's disease but not on any chronic controlling medications.  She also has a history of hepatitis C which complicates the ability to control her Crohn's. -was placed on  IV Solu-Medrol, and pain control -IV discontinued, placed on clear liquids, able to tolerate -GI recommending outpatient follow up with primary gastroenterologist and steroid taper, low fiber diet -Called patient's pharmacy, she has a script for Budesonide which has not been picked up yet. Discussed with GI- she can take budesonide instead of prednisone taper. -given 5 days of hydrocodone for pain until she is able to follow up with her PCP or GI physician  Partial small bowel obstruction -History of colectomy 7/20 -Has had recurrent episodes of partial SBO's with spontaneous resolution in the past -Continue pain control -she is not complaining of nausea or vomiting -She wants to eat and feels very hungry, but continues to complain of pain   Chronic Microcytic Anemia  -On admission, hemoglobin was 6.7, given 1 unit PRBC -hemoglobin 8.5 -Anemia panel showed an iron of 14, ferritin of 6 -given dose of Feraheme -Repeat CBC in one week  Uterine fibroid -Concern for abnormal myometrium at [facility] on 08/29/2019 -Transvaginal ultrasound showed apparent submucosal fibroid -Suggest gynecological follow-up on discharge  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 -Concern for nonocclusive splenic vein with thrombus while at outside facility [facility] -Patient with history of LV thrombus for which she was started on Coumadin but appears to no  longer be taking this medication as her INR is 1.1 -Liver Doppler ultrasound is unremarkable  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 -This was reported in [facility] records -Currently she appears to be compensated and not complaining of chest pain -Continue Coreg and aspirin -Patient will need to follow-up as an outpatient with cardiology  Opioid dependence -Denies history of heroin use but does have a history of hepatitis C -Admits to taking Suboxone however recently discontinued due to daily pain -Feels that morphine is not helping control her pain she has a very high tolerance -It was recommended the patient go to the pain clinic however preferred not to do this -Patient prefers Percocet or other opiates as needed for pain -She has demonstrated pattern of leaving hospital soon inadequate pain control -Follow up with PCP/pain management  Bipolar disorder -Continue Seroquel  GERD -Continue PPI  Tobacco dependence -Smoking cessation discussed, patch declined by patient  Consultants Gastroenterology  Procedures  None Discharge Exam: Vitals:   09/01/19 2102 09/02/19 0456  BP: 132/81 132/78  Pulse: 72 65  Resp: 16 16  Temp: 98.2 F (36.8 C) 98.2 F (36.8 C)  SpO2: 97% 97%     General: Well developed, well nourished, NAD, appears stated age  HEENT: NCAT, mucous membranes moist.  Cardiovascular: S1 S2 auscultated, RRR, no murmur  Respiratory: Clear to auscultation bilaterally with equal chest rise  Abdomen: Soft, nontender, nondistended, +  bowel sounds, well healed surgical scar  Extremities: warm dry without cyanosis clubbing or edema  Neuro: AAOx3, nonfocal  Psych: Normal affect and demeanor  Discharge Instructions Discharge Instructions    Discharge instructions   Complete by: As directed    Patient will be discharged to home.  Patient will need to follow up with primary care provider within one week of discharge, repeat CBC.   Follow up with gastroenterology. Patient should continue medications as prescribed.  Patient should follow a soft/ low fiber diet.     Allergies as of 09/02/2019   No Known Allergies     Medication List    TAKE these medications   aspirin 81 MG EC tablet Take 1 tablet (81 mg total) by mouth daily. Start taking on: September 03, 2019   carvedilol 3.125 MG tablet Commonly known as: COREG Take 3.125 mg by mouth 2 (two) times daily.   cyclobenzaprine 5 MG tablet Commonly known as: FLEXERIL Take 5 mg by mouth 2 (two) times daily.   Dexilant 60 MG capsule Generic drug: dexlansoprazole Take 60 mg by mouth daily.   gabapentin 300 MG capsule Commonly known as: NEURONTIN Take 300 mg by mouth 2 (two) times daily.   HYDROcodone-acetaminophen 5-325 MG tablet Commonly known as: NORCO/VICODIN Take 1 tablet by mouth every 6 (six) hours as needed for moderate pain or severe pain.   multivitamin with minerals Tabs tablet Take 1 tablet by mouth daily.   QUEtiapine 50 MG tablet Commonly known as: SEROQUEL Take 50 mg by mouth at bedtime.      No Known Allergies Follow-up Information    Royetta Car., MD. Schedule an appointment as soon as possible for a visit.   Specialty: Gastroenterology Why: follow up Crohn's disease Contact information: [place] [address] [phone]        Jeneen Montgomery, MD. Schedule an appointment as soon as possible for a visit.   Specialty: Gastroenterology Why: call for follow up of Hepatitis C.   Contact information: [place] [address] [phone]            The results of significant diagnostics from this hospitalization (including imaging, microbiology, ancillary and laboratory) are listed below for reference.    Significant Diagnostic Studies: US Liver Doppler  Result Date: 08/31/2019 CLINICAL DATA:  Crohn's colitis. EXAM: DUPLEX ULTRASOUND OF LIVER TECHNIQUE: Color and duplex Doppler  ultrasound was performed to evaluate the hepatic in-flow and out-flow vessels. COMPARISON:  None. FINDINGS: Liver: Normal parenchymal echogenicity. Normal hepatic contour without nodularity. No focal lesion, mass or intrahepatic biliary ductal dilatation. Main Portal Vein size: 1.5 cm Portal Vein Velocities Main Prox:  31.4 cm/sec Main Mid: 38.1 cm/sec Main Dist:  33.9 cm/sec Right: 41.6 cm/sec Left: 24.3 cm/sec Normal hepatopetal flow is noted in the portal veins. Hepatic Vein Velocities Right:  62.3 cm/sec Middle:  31.9 cm/sec Left:  63.3 cm/sec Normal hepatofugal flow is noted in the hepatic veins. IVC: Present and patent with normal respiratory phasicity. Hepatic Artery Velocity:  61.9 cm/sec Splenic Vein Velocity:  43.1 cm/sec Spleen: 6.2 cm x 9.9 cm x 5.1 cm with a total volume of 166 cm^3 (411 cm^3 is upper limit normal) Portal Vein Occlusion/Thrombus: No Splenic Vein Occlusion/Thrombus: No Ascites: None Varices: None IMPRESSION: No Doppler evidence of portal, hepatic or splenic venous thrombosis or occlusion. Electronically Signed   By: Marijo Conception M.D.   On: 08/31/2019 12:45    Microbiology: Recent Results (from the past 240  hour(s))  SARS CORONAVIRUS 2 (TAT 6-24 HRS) Nasopharyngeal Nasopharyngeal Swab     Status: None   Collection Time: 08/31/19 10:43 AM   Specimen: Nasopharyngeal Swab  Result Value Ref Range Status   SARS Coronavirus 2 NEGATIVE NEGATIVE Final    Comment: (NOTE) SARS-CoV-2 target nucleic acids are NOT DETECTED. The SARS-CoV-2 RNA is generally detectable in upper and lower respiratory specimens during the acute phase of infection. Negative results do not preclude SARS-CoV-2 infection, do not rule out co-infections with other pathogens, and should not be used as the sole basis for treatment or other patient management decisions. Negative results must be combined with clinical observations, patient history, and epidemiological information. The expected result is  Negative. Fact Sheet for Patients: SugarRoll.be Fact Sheet for Healthcare Providers: https://www.woods-mathews.com/ This test is not yet approved or cleared by the [place] FDA and  has been authorized for detection and/or diagnosis of SARS-CoV-2 by FDA under an Emergency Use Authorization (EUA). This EUA will remain  in effect (meaning this test can be used) for the duration of the COVID-19 declaration under Section 56 4(b)(1) of the Act, 21 U.S.C. section 360bbb-3(b)(1), unless the authorization is terminated or revoked sooner. Performed at [facility], [place] [address], [place], [place] [address]      Labs: Basic Metabolic Panel: Recent Labs  Lab 08/31/19 1106 09/01/19 0432  NA 139 142  K 3.6 3.6  CL 111 111  CO2 21* 25  GLUCOSE 99 86  BUN 9 6  CREATININE 0.79 0.75  CALCIUM 7.7* 8.0*   Liver Function Tests: Recent Labs  Lab 08/31/19 1106  AST 21  ALT 11  ALKPHOS 103  BILITOT 0.4  PROT 4.2*  ALBUMIN 1.3*   No results for input(s): LIPASE, AMYLASE in the last 168 hours. No results for input(s): AMMONIA in the last 168 hours. CBC: Recent Labs  Lab 08/31/19 1106 08/31/19 2040 09/01/19 0432 09/02/19 1142  WBC 4.8  --  6.2  --   NEUTROABS 2.4  --   --   --   HGB 6.7* 7.9* 7.6* 8.5*  HCT 22.3* 25.8* 25.2* 28.0*  MCV 76.1*  --  77.5*  --   PLT 311  --  281  --    Cardiac Enzymes: No results for input(s): CKTOTAL, CKMB, CKMBINDEX, TROPONINI in the last 168 hours. BNP: BNP (last 3 results) No results for input(s): BNP in the last 8760 hours.  ProBNP (last 3 results) No results for input(s): PROBNP in the last 8760 hours.  CBG: No results for input(s): GLUCAP in the last 168 hours.     Signed:  Cristal Ford  [facility] 09/02/2019, 12:38 PM

## 2019-09-02 NOTE — Progress Notes (Addendum)
Progress Note    ASSESSMENT AND RECOMMENDATIONS:   43 yo female with pmh significant for systolic CHF, NSTEMI with LV thrombus, HTN, untreated HCV, chronic pain, ileocolonic stricturing Crohn's s/p right colectomy / ileocolonic anastomosis 04/25/19. She . Not on only Crohn's meds.   1. Two recent ED visits in [place] with CT scans concerning for active Crohn's and partial SBO. Patient left facilities because pain wasn't being controlled. Wants to eat. Asking for discharge. Just found out that her primary GI sent prescription for Budesonide to her pharmacy.  -She has been requiring frequent doses of pain meds but abdominal exam not overly concerning, she is hungry ( good sign), afebrile and WBC normal as of yesterday. Would advance to low fiber diet ( strictures) and discharge home. I spoke with patient and she will call her GI tomorrow to make an appointment. Will hold off on discharging home on steroids since picking up Rx for Budesonide today   2. Severe IDA.  --Got dose of Feraheme yesterday.   3. Hx of opiod dependence / abuse. On Suboxone. Getting IV morphine around the clock, 11 doses yesterday.   4. HCV infection. Awaiting     Attending Physician Note   I have taken an interval history, reviewed the chart and examined the patient. I agree with the Advanced Practitioner's note, impression and recommendations.   Crohn's ileocolitis, with partial SBO. Prior right hemicolectomy / ileocolonic anastomosis, acute symptoms resolving Severe IDA. Received Feraheme HCV infection  Opioid dependence / abuse  Advance diet as tolerated Budesonide as prescibed Follow up with Dr. Alfonse Spruce GI in [place], MD [facility]     SUBJECTIVE   Hungry, wants solid food. Wants to go home.    OBJECTIVE:     Vital signs in last 24 hours: Temp:  [98.2 F (36.8 C)-98.5 F (36.9 C)] 98.2 F (36.8 C) (11/27 0456) Pulse Rate:  [61-72] 65 (11/27 0456)  Resp:  [16-18] 16 (11/27 0456) BP: (122-132)/(75-81) 132/78 (11/27 0456) SpO2:  [97 %-100 %] 97 % (11/27 0456) Last BM Date: (PTA) General:   Alert, well-developed female in NAD EENT:  Normal hearing, non icteric sclera   Heart:  Regular rate and rhythm;  No lower extremity edema   Pulm: Normal respiratory effort   Abdomen:  Soft, nondistended, nontender.  Normal bowel sounds.          Neurologic:  Alert and  oriented x4;  grossly normal neurologically. Psych:  Pleasant, cooperative.  Normal mood and affect.   Intake/Output from previous day: 11/26 0701 - 11/27 0700 In: 720 [P.O.:720] Out: 1300 [Urine:1300] Intake/Output this shift: No intake/output data recorded.  Lab Results: Recent Labs    08/31/19 1106 08/31/19 2040 09/01/19 0432  WBC 4.8  --  6.2  HGB 6.7* 7.9* 7.6*  HCT 22.3* 25.8* 25.2*  PLT 311  --  281   BMET Recent Labs    08/31/19 1106 09/01/19 0432  NA 139 142  K 3.6 3.6  CL 111 111  CO2 21* 25  GLUCOSE 99 86  BUN 9 6  CREATININE 0.79 0.75  CALCIUM 7.7* 8.0*   LFT Recent Labs    08/31/19 1106  PROT 4.2*  ALBUMIN 1.3*  AST 21  ALT 11  ALKPHOS 103  BILITOT 0.4   PT/INR Recent Labs    08/31/19 1106  LABPROT 16.7*  INR 1.4*    US Liver Doppler  Result Date: 08/31/2019 CLINICAL DATA:  Crohn's colitis. EXAM:  DUPLEX ULTRASOUND OF LIVER TECHNIQUE: Color and duplex Doppler ultrasound was performed to evaluate the hepatic in-flow and out-flow vessels. COMPARISON:  None. FINDINGS: Liver: Normal parenchymal echogenicity. Normal hepatic contour without nodularity. No focal lesion, mass or intrahepatic biliary ductal dilatation. Main Portal Vein size: 1.5 cm Portal Vein Velocities Main Prox:  31.4 cm/sec Main Mid: 38.1 cm/sec Main Dist:  33.9 cm/sec Right: 41.6 cm/sec Left: 24.3 cm/sec Normal hepatopetal flow is noted in the portal veins. Hepatic Vein Velocities Right:  62.3 cm/sec Middle:  31.9 cm/sec Left:  63.3 cm/sec Normal hepatofugal flow is  noted in the hepatic veins. IVC: Present and patent with normal respiratory phasicity. Hepatic Artery Velocity:  61.9 cm/sec Splenic Vein Velocity:  43.1 cm/sec Spleen: 6.2 cm x 9.9 cm x 5.1 cm with a total volume of 166 cm^3 (411 cm^3 is upper limit normal) Portal Vein Occlusion/Thrombus: No Splenic Vein Occlusion/Thrombus: No Ascites: None Varices: None IMPRESSION: No Doppler evidence of portal, hepatic or splenic venous thrombosis or occlusion. Electronically Signed   By: Marijo Conception M.D.   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

## 2019-09-02 NOTE — Progress Notes (Signed)
PROGRESS NOTE    Christine Mcdaniel  VHQ:[id number] DOB: [birth year] DOA: 08/31/2019 PCP: Patient, No Pcp Per   Brief Narrative:  43 year old female with a history of Crohn's, presented with abdominal pain.  Patient admitted with partial SBO and Crohn's flare.  GI consulted.  Currently on IV steroids and IV fluids. Assessment & Plan   Crohn's flare -Patient with history of Crohn's disease but not on any chronic controlling medications.  She also has a history of hepatitis C which complicates the ability to control her Crohn's. -Continue IV Solu-Medrol, and pain control -patient requested to discontinue IVF -she feels morphine 12m q2hr PRN is not controlling her pain -pending further recommendations from GI  Partial small bowel obstruction -History of colectomy 7/20 -Has had recurrent episodes of partial SBO's with spontaneous resolution in the past -Continue bowel rest, n.p.o. -Patient refuses NG tube -Continue pain control -she is not complaining of nausea or vomiting -She wants to eat and feels very hungry, but continues to complain of pain  -placed patient on clear liquid diet and tolerated well, but continues to have pain   Chronic Microcytic Anemia  -On admission, hemoglobin was 6.7, given 1 unit PRBC -pending H/H today -Anemia panel showed an iron of 14, ferritin of 6 -given dose of Feraheme -Monitor CBC  Uterine fibroid -[facility] -Transvaginal ultrasound showed apparent submucosal fibroid -Suggest gynecological follow-up on discharge  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 -Concern for nonocclusive splenic vein with [facility]-Patient with history of LV thrombus for which she was started on Coumadin but appears to no longer be taking this medication as her INR is 1.1 -Liver Doppler ultrasound is unremarkable  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  hypertension -[facility] -Currently she appears to be compensated and not complaining of chest pain -Continue Coreg and aspirin -Patient will need to follow-up as an outpatient with cardiology  Opioid dependence -Denies history of heroin use but does have a history of hepatitis C -Admits to taking Suboxone however recently discontinued due to daily pain -Feels that morphine is not helping control her pain she has a very high tolerance -It was recommended the patient go to the pain clinic however preferred not to do this -Patient prefers Percocet or other opiates as needed for pain -She has demonstrated pattern of leaving hospital soon inadequate pain control -Will continue judicious use of opiates and transition off of IV as able to  Bipolar disorder -Continue Seroquel  GERD -Continue PPI  Tobacco dependence -Discussed cessation, patch declined by patient  DVT Prophylaxis Lovenox  Code Status: Full  Family Communication: None at bedside  Disposition Plan: Admitted.  Pending further GI recommendations. Suspect home when stable  Consultants Gastroenterology  Procedures  None  Antibiotics   Anti-infectives (From admission, onward)   None      Subjective:   CFilomena Pokorneyseen and examined today.  Feels that morphine is not adequately controlling her pain.  Continues to complain of abdominal pain but feels very hungry. Denies current chest pain or shortness of breath, nausea or vomiting, diarrhea or constipation, dizziness or headache.  Would like to go home soon.    Objective:   Vitals:   09/01/19 0544 09/01/19 1329 09/01/19 2102 09/02/19 0456  BP: 109/76 122/75 132/81 132/78  Pulse: 77 61 72 65  Resp: 16 18 16 16   Temp: 98.1 F (36.7 C) 98.5 F (  36.9 C) 98.2 F (36.8 C) 98.2 F (36.8 C)  TempSrc: Oral Oral Oral Oral  SpO2: 100% 100% 97% 97%    Intake/Output Summary (Last 24 hours) at 09/02/2019 1011 Last data filed at  09/02/2019 0500 Gross per 24 hour  Intake 720 ml  Output 1300 ml  Net -580 ml   There were no vitals filed for this visit.  Exam  General: Well developed, well nourished, NAD, appears stated age  43: NCAT, mucous membranes moist.   Cardiovascular: S1 S2 auscultated, RRR, no murmur  Respiratory: Clear to auscultation bilaterally with equal chest rise  Abdomen: Soft, nontender, nondistended, + bowel sounds, well-healed surgical scar  Extremities: warm dry without cyanosis clubbing or edema  Neuro: AAOx3, nonfocal  Psych: Appropriate, tearful  Data Reviewed: I have personally reviewed following labs and imaging studies  CBC: Recent Labs  Lab 08/31/19 1106 08/31/19 2040 09/01/19 0432  WBC 4.8  --  6.2  NEUTROABS 2.4  --   --   HGB 6.7* 7.9* 7.6*  HCT 22.3* 25.8* 25.2*  MCV 76.1*  --  77.5*  PLT 311  --  250   Basic Metabolic Panel: Recent Labs  Lab 08/31/19 1106 09/01/19 0432  NA 139 142  K 3.6 3.6  CL 111 111  CO2 21* 25  GLUCOSE 99 86  BUN 9 6  CREATININE 0.79 0.75  CALCIUM 7.7* 8.0*   GFR: CrCl cannot be calculated (Unknown ideal weight.). Liver Function Tests: Recent Labs  Lab 08/31/19 1106  AST 21  ALT 11  ALKPHOS 103  BILITOT 0.4  PROT 4.2*  ALBUMIN 1.3*   No results for input(s): LIPASE, AMYLASE in the last 168 hours. No results for input(s): AMMONIA in the last 168 hours. Coagulation Profile: Recent Labs  Lab 08/31/19 1106  INR 1.4*   Cardiac Enzymes: No results for input(s): CKTOTAL, CKMB, CKMBINDEX, TROPONINI in the last 168 hours. BNP (last 3 results) No results for input(s): PROBNP in the last 8760 hours. HbA1C: No results for input(s): HGBA1C in the last 72 hours. CBG: No results for input(s): GLUCAP in the last 168 hours. Lipid Profile: No results for input(s): CHOL, HDL, LDLCALC, TRIG, CHOLHDL, LDLDIRECT in the last 72 hours. Thyroid Function Tests: No results for input(s): TSH, T4TOTAL, FREET4, T3FREE, THYROIDAB in  the last 72 hours. Anemia Panel: Recent Labs    09/01/19 0432  VITAMINB12 537  FOLATE 10.7  FERRITIN 6*  TIBC 174*  IRON 14*  RETICCTPCT 2.1   Urine analysis: No results found for: COLORURINE, APPEARANCEUR, LABSPEC, PHURINE, GLUCOSEU, HGBUR, BILIRUBINUR, KETONESUR, PROTEINUR, UROBILINOGEN, NITRITE, LEUKOCYTESUR Sepsis Labs: @LABRCNTIP (procalcitonin:4,lacticidven:4)  ) Recent Results (from the past 240 hour(s))  SARS CORONAVIRUS 2 (TAT 6-24 HRS) Nasopharyngeal Nasopharyngeal Swab     Status: None   Collection Time: 08/31/19 10:43 AM   Specimen: Nasopharyngeal Swab  Result Value Ref Range Status   SARS Coronavirus 2 NEGATIVE NEGATIVE Final    Comment: (NOTE) SARS-CoV-2 target nucleic acids are NOT DETECTED. The SARS-CoV-2 RNA is generally detectable in upper and lower respiratory specimens during the acute phase of infection. Negative results do not preclude SARS-CoV-2 infection, do not rule out co-infections with other pathogens, and should not be used as the sole basis for treatment or other patient management decisions. Negative results must be combined with clinical observations, patient history, and epidemiological information. The expected result is Negative. Fact Sheet for Patients: SugarRoll.be Fact Sheet for Healthcare Providers: https://www.woods-mathews.com/ This test is not yet approved or cleared by the  [place] Architectural technologist and  has been authorized for detection and/or diagnosis of SARS-CoV-2 by FDA under an Print production planner (EUA). This EUA will remain  in effect (meaning this test can be used) for the duration of the COVID-19 declaration under Section 56 4(b)(1) of the Act, 21 U.S.C. section 360bbb-3(b)(1), unless the authorization is terminated or revoked sooner. Performed at [facility], [place] [address] [place], [place] [address]       Radiology Studies: US Liver Doppler  Result Date:  08/31/2019 CLINICAL DATA:  Crohn's colitis. EXAM: DUPLEX ULTRASOUND OF LIVER TECHNIQUE: Color and duplex Doppler ultrasound was performed to evaluate the hepatic in-flow and out-flow vessels. COMPARISON:  None. FINDINGS: Liver: Normal parenchymal echogenicity. Normal hepatic contour without nodularity. No focal lesion, mass or intrahepatic biliary ductal dilatation. Main Portal Vein size: 1.5 cm Portal Vein Velocities Main Prox:  31.4 cm/sec Main Mid: 38.1 cm/sec Main Dist:  33.9 cm/sec Right: 41.6 cm/sec Left: 24.3 cm/sec Normal hepatopetal flow is noted in the portal veins. Hepatic Vein Velocities Right:  62.3 cm/sec Middle:  31.9 cm/sec Left:  63.3 cm/sec Normal hepatofugal flow is noted in the hepatic veins. IVC: Present and patent with normal respiratory phasicity. Hepatic Artery Velocity:  61.9 cm/sec Splenic Vein Velocity:  43.1 cm/sec Spleen: 6.2 cm x 9.9 cm x 5.1 cm with a total volume of 166 cm^3 (411 cm^3 is upper limit normal) Portal Vein Occlusion/Thrombus: No Splenic Vein Occlusion/Thrombus: No Ascites: None Varices: None IMPRESSION: No Doppler evidence of portal, hepatic or splenic venous thrombosis or occlusion. Electronically Signed   By: Marijo Conception M.D.   On: 08/31/2019 12:45     Scheduled Meds: . aspirin EC  81 mg Oral Daily  . carvedilol  3.125 mg Oral BID  . cyclobenzaprine  5 mg Oral BID  . enoxaparin (LOVENOX) injection  40 mg Subcutaneous Q24H  . gabapentin  300 mg Oral BID  . methylPREDNISolone (SOLU-MEDROL) injection  60 mg Intravenous Daily  . neomycin-bacitracin-polymyxin   Topical BID  . pantoprazole (PROTONIX) IV  40 mg Intravenous Q24H  . QUEtiapine  50 mg Oral QHS  . sodium chloride flush  10-40 mL Intracatheter Q12H   Continuous Infusions:    LOS: 2 days   Time Spent in minutes   30 minutes  Trevione Wert D.O. on 09/02/2019 at 10:11 AM  Between 7am to 7pm - Please see pager noted on [email or web address]  After 7pm go to [email or web address]  And look for the night  coverage person covering for me after hours  [facility] Office  [phone]

## 2019-09-02 NOTE — Discharge Instructions (Signed)
Low-Fiber Eating Plan Fiber is found in fruits, vegetables, whole grains, and beans. Eating a diet low in fiber helps to reduce how often you have bowel movements and how much you produce during a bowel movement. A low-fiber eating plan may help your digestive system heal if:  You have certain conditions, such as Crohn's disease or diverticulitis.  You recently had radiation therapy on your pelvis or bowel.  You recently had intestinal surgery.  You have a new surgical opening in your abdomen (colostomy or ileostomy).  Your intestine is narrowed (stricture). Your health care provider will determine how long you need to stay on this diet. Your health care provider may recommend that you work with a diet and nutrition specialist (dietitian). What are tips for following this plan? General guidelines  Follow recommendations from your dietitian about how much fiber you should have each day.  Most people on this eating plan should try to eat less than 10 grams (g) of fiber each day. Your daily fiber goal is _________________ g.  Take vitamin and mineral supplements as told by your health care provider or dietitian. Chewable or liquid forms are best when on this eating plan. Reading food labels  Check food labels for the amount of dietary fiber.  Choose foods that have less than 2 grams of fiber in one serving. Cooking  Use white flour and other allowed grains for baking and cooking.  Cook meat using methods that keep it tender, such as braising or poaching.  Cook eggs until the yolk is completely solid.  Cook with healthy oils, such as olive oil or canola oil. Meal planning   Eat 5-6 small meals throughout the day instead of 3 large meals.  If you are lactose intolerant: ? Choose low-lactose dairy foods. ? Do not eat dairy foods, if told by your dietitian.  Limit fat and oils to less than 8 teaspoons a day.  Eat small portions of desserts. What foods are allowed? The items  listed below may not be a complete list. Talk with your dietitian about what dietary choices are best for you. Grains All bread and crackers made with white flour. Waffles, pancakes, and [place] toast. Bagels. Pretzels. Melba toast, zwieback, and matzoh. Cooked and dried cereals that do not contain whole grains, added fiber, seeds, or dried fruit. CornmealDomenick Gong. Hot and cold cereals made with refined corn, wheat, rice, or oats. Plain pasta and noodles. White rice. Vegetables Well-cooked or canned vegetables without skin, seeds, or stems. Cooked potatoes without skins. Vegetable juice. Fruits Soft-cooked or canned fruits without skin and seeds. Peeled ripe banana. Applesauce. Fruit juice without pulp. Meats and other protein foods Ground meat. Tender cuts of meat or poultry. Eggs. Fish, seafood, and shellfish. Smooth nut butters. Tofu. Dairy All milk products and drinks. Lactose-free milks, including rice, soy, and almond milks. Yogurt without fruit, nuts, chocolate, or granola mix-ins. Sour cream. Cottage cheese. Cheese. Beverages Decaf coffee. Fruit and vegetable juices or smoothies (in small amounts, with no pulp or skins, and with fruits from allowed list). Sports drinks. Herbal tea. Fats and oils Olive oil, canola oil, sunflower oil, flaxseed oil, and grapeseed oil. Mayonnaise. Cream cheese. Margarine. Butter. Sweets and desserts Plain cakes and cookies. Cream pies and pies made with allowed fruits. Pudding. Custard. Fruit gelatin. Sherbet. Popsicles. Ice cream without nuts. Plain hard candy. Honey. Jelly. Molasses. Syrups, including chocolate syrup. Chocolate. Marshmallows. Gumdrops. Seasoning and other foods Bouillon. Broth. Cream soups made from allowed foods. Strained soup. Casseroles made  with allowed foods. Ketchup. Mild mustard. Mild salad dressings. Plain gravies. Vinegar. Spices in moderation. Salt. Sugar. What foods are not allowed? The items listed below may not be a complete  list. Talk with your dietitian about what dietary choices are best for you. Grains Whole wheat and whole grain breads and crackers. Multigrain breads and crackers. Rye bread. Whole grain or multigrain cereals. Cereals with nuts, raisins, or coconut. Bran. Coarse wheat cereals. Granola. High-fiber cereals. Cornmeal or corn bread. Whole grain pasta. Wild or brown rice. Quinoa. Popcorn. Buckwheat. Wheat germ. Vegetables Potato skins. Raw or undercooked vegetables. All beans and bean sprouts. Cooked greens. Corn. Peas. Cabbage. Beets. Broccoli. Brussels sprouts. Cauliflower. Mushrooms. Onions. Peppers. Parsnips. Okra. Sauerkraut. Fruit Raw or dried fruit. Berries. Fruit juice with pulp. Prune juice. Meats and other protein foods Tough, fibrous meats with gristle. Fatty meat. Poultry with skin. Fried meat, Sales executive, or fish. Deli or lunch meats. Sausage, bacon, and hot dogs. Nuts and chunky nut butter. Dried peas, beans, and lentils. Dairy Yogurt with fruit, nuts, chocolate, or granola mix-ins. Beverages Caffeinated coffee and teas. Fats and oils Avocado. Coconut. Sweets and desserts Desserts, cookies, or candies that contain nuts or coconut. Dried fruit. Jams and preserves with seeds. Marmalade. Any dessert made with fruits or grains that are not allowed. Seasoning and other foods Corn tortilla chips. Soups made with vegetables or grains that are not allowed. Relish. Horseradish. Angie Fava. Olives. Summary  Most people on a low-fiber eating plan should eat less than 10 grams of fiber a day. Follow recommendations from your dietitian about how much fiber you should have each day.  Always check food labels to see the dietary fiber content of packaged foods. In general, a low-fiber food will have fewer than 2 grams of fiber per serving.  In general, try to avoid whole grains, raw fruits and vegetables, dried fruit, tough cuts of meat, nuts, and seeds.  Take a vitamin and mineral supplement as told  by your health care provider or dietitian. This information is not intended to replace advice given to you by your health care provider. Make sure you discuss any questions you have with your health care provider. Document Released: 03/14/2002 Document Revised: 01/14/2019 Document Reviewed: 11/25/2016 Elsevier Patient Education  2020 [employer]  Crohn's disease is a long-lasting (chronic) disease that affects the gastrointestinal (GI) tract. Crohn's disease often causes irritation and inflammation in the small intestine and the beginning of the large intestine, but it can affect any part of the GI tract. Crohn's disease is part of a group of illnesses that are known as inflammatory bowel disease (IBD). Crohn's disease may start slowly and get worse over time. Symptoms may come and go. They may also go away for months or even years at a time (remission). What are the causes? The exact cause of this condition is not known. It may involve a response that causes your body's disease-fighting (immune) system to attack healthy cells and tissues (autoimmune response). Bacteria, genes, and your environment may also play a role. What increases the risk? The following factors may make you more likely to develop this condition:  Having a family member who has Crohn's disease, another IBD, or an autoimmune condition.  Using products that contain nicotine or tobacco, such as cigarettes and e-cigarettes.  Being in your 63s.  Having [place] European ancestry. What are the signs or symptoms? The main symptoms of this condition involve your GI tract. These include:  Diarrhea.  Pain or cramping  in the abdomen. This is commonly felt in the lower right side of the abdomen.  Frequent watery or bloody stools.  Constipation. This may mean having: ? Fewer bowel movements in a week than normal. ? Difficulty having a bowel movement. ? Stools that are dry, hard, or larger than  normal.  Rectal bleeding.  Rectal pain.  An urgent need to have a bowel movement.  The feeling that you are not finished having a bowel movement. Other symptoms may include:  Unexplained weight loss.  Fatigue.  Fever.  Nausea.  Loss of appetite.  Joint pain.  Vision changes.  Red bumps or sores on the skin.  Sores inside the mouth. How is this diagnosed? This condition may be diagnosed based on:  Your symptoms and your medical history.  A physical exam.  Tests, which may include: ? Blood tests. ? Stool sample tests. ? Imaging tests, such as X-rays and CT scans. ? Tests to examine the inside of your intestines using a long, flexible tube that has a light and a camera on the end (endoscopy or colonoscopy). ? A procedure to remove tissue samples from inside your bowel for testing (biopsy). You may need to work with a health care provider who specializes in diseases of the digestive tract (gastroenterologist). How is this treated? There is no cure for this condition, but treatment can help you manage your symptoms. Crohn's disease affects each person differently. Your treatment may include:  Resting your bowels. This involves having a period of healing time when your bowels are not passing stools. This may be done by: ? Drinking only clear liquids. These are liquids that you can see through, such as water, black coffee, fruit juice without pulp, broth, gelatin, and ice pops. ? Getting nutrition through an IV for a period of time.  Medicines. These may be used by themselves or with other treatments (combination therapy). These may include antibiotic medicines. You may be given medicines that help to: ? Reduce inflammation. ? Control your immune system activity. ? Fight infections. ? Relieve cramps and prevent diarrhea. ? Control your pain.  Surgery. You may need surgery if: ? Medicines and other treatments are not working anymore. ? You develop complications from  severe Crohn's disease. ? A section of your intestine becomes so damaged that it needs to be removed. Follow these instructions at home: Medicines  Take over-the-counter and prescription medicines only as told by your health care provider.  If you were prescribed an antibiotic, take it as told by your health care provider. Do not stop taking the antibiotic even if you start to feel better.  Avoid taking ibuprofen or other NSAID medicines if possible, these can make Crohn's disease worse. Eating and drinking  Talk with your health care provider or a diet and nutrition specialist (registered dietitian) about what diet is best for you.  Drink enough fluid to keep your urine pale yellow.  If you are taking steroids to reduce inflammation, get plenty of calcium in your diet to help keep your bones healthy. You may also consider taking a calcium supplement with vitamin D.  Keep a food diary to identify foods that make your symptoms better or worse.  Avoid foods that cause symptoms.  Follow instructions from your health care provider about eating or drinking restrictions if you have worsening symptoms (flare-up).  Limit alcohol intake to no more than 1 drink a day for nonpregnant women and 2 drinks a day for men. One drink equals 12 oz  of beer, 5 oz of wine, or 1 oz of hard liquor. General instructions  Make sure you get all the vaccines that your health care provider recommends, especially pneumonia (pneumococcal) and flu (influenza) vaccines.  Do not use any products that contain nicotine or tobacco, such as cigarettes and e-cigarettes. If you need help quitting, ask your health care provider.  Exercise every day, or as often told by your health care provider.  Keep all follow-up visits as told by your health care provider. This is important. Contact a health care provider if:  You have diarrhea, cramps in your abdomen, and other GI problems that are present almost all the  time.  Your symptoms do not improve with treatment.  You continue to lose weight.  You develop a rash or sores on your skin.  You develop eye problems.  You have a fever.  Your symptoms get worse.  You develop new symptoms. Get help right away if:  You have bloody diarrhea.  You have severe pain in your abdomen.  You cannot pass stools. Summary  Crohn's disease affects each person differently. There are multiple treatment options that can help you manage the condition.  Talk with your health care provider or diet and nutrition specialist (registered dietitian) about what diet is best for you.  Make sure you get all the vaccines that your health care provider recommends, especially pneumonia (pneumococcal) and flu (influenza) vaccines. This information is not intended to replace advice given to you by your health care provider. Make sure you discuss any questions you have with your health care provider. Document Released: 07/02/2005 Document Revised: 09/04/2017 Document Reviewed: 05/25/2017 Elsevier Patient Education  2020 [employer].

## 2019-09-02 NOTE — Progress Notes (Signed)
Nutrition Brief Note  Patient identified on the Malnutrition Screening Tool (MST) Report  Wt Readings from Last 15 Encounters:  09/02/19 43.59 kg   43 year old female with a history of Crohn's, presented with abdominal pain.  Patient admitted with partial SBO and Crohn's flare.  GI consulted.  Currently on IV steroids and IV fluids.  Pt admitted with crohn's flare and partial SBO.   Spoke with pt at bedside, who reports she is feeling much better. She shares that she is eager to advance her diet to soft as "I am starving". Pt shares that she had poor appetite over the past 3-4 days PTA, due to nausea and vomiting when eating. At baseline, she has a good appetite and consumes 3 meals per day.   Pt reports that she has lost 80# over the past year, due to multiple hospitalizations related to crohn's disease. She is unsure of UBW. No wt hx available to assess accuracy of weight changes. Nutrition-Focused physical exam completed. Findings are no fat depletion, no muscle depletion, and no edema.   Discussed importance of good meal intake to promote healing.   Body mass index is 27.43 kg/m. Patient meets criteria for overweight based on current BMI.   Current diet order is soft, patient is consuming approximately 100% of meals at this time. Labs and medications reviewed.   No nutrition interventions warranted at this time. If nutrition issues arise, please consult RD.   Natallie Ravenscroft A. Jimmye Norman, RD, LDN, [place] Registered Dietitian II Certified Diabetes Care and Education Specialist Pager: [phone] After hours Pager: [phone]

## 2020-04-19 ENCOUNTER — Encounter (HOSPITAL_COMMUNITY): Payer: Self-pay | Admitting: Pediatrics

## 2020-04-19 ENCOUNTER — Emergency Department (HOSPITAL_COMMUNITY)
Admission: EM | Admit: 2020-04-19 | Discharge: 2020-04-19 | Disposition: A | Discharge status: Home / Self Care | Payer: BC Managed Care – PPO | Attending: Emergency Medicine | Admitting: Emergency Medicine

## 2020-04-19 ENCOUNTER — Other Ambulatory Visit: Payer: Self-pay

## 2020-04-19 DIAGNOSIS — Z5321 Procedure and treatment not carried out due to patient leaving prior to being seen by health care provider: Secondary | ICD-10-CM | POA: Diagnosis not present

## 2020-04-19 DIAGNOSIS — R634 Abnormal weight loss: Secondary | ICD-10-CM | POA: Diagnosis present

## 2020-04-19 DIAGNOSIS — R109 Unspecified abdominal pain: Secondary | ICD-10-CM | POA: Insufficient documentation

## 2020-04-19 LAB — COMPREHENSIVE METABOLIC PANEL
ALT: 18 U/L (ref 0–44)
AST: 26 U/L (ref 15–41)
Albumin: 1.2 g/dL — ABNORMAL LOW (ref 3.5–5.0)
Alkaline Phosphatase: 135 U/L — ABNORMAL HIGH (ref 38–126)
Anion gap: 9 (ref 5–15)
BUN: 19 mg/dL (ref 6–20)
CO2: 17 mmol/L — ABNORMAL LOW (ref 22–32)
Calcium: 7.6 mg/dL — ABNORMAL LOW (ref 8.9–10.3)
Chloride: 111 mmol/L (ref 98–111)
Creatinine, Ser: 1.2 mg/dL — ABNORMAL HIGH (ref 0.44–1.00)
GFR calc Af Amer: >60 mL/min (ref >60)
GFR calc non Af Amer: 54 mL/min — ABNORMAL LOW (ref >60)
Glucose, Bld: 111 mg/dL — ABNORMAL HIGH (ref 70–99)
Potassium: 4.2 mmol/L (ref 3.5–5.1)
Sodium: 137 mmol/L (ref 135–145)
Total Bilirubin: 0.5 mg/dL (ref 0.3–1.2)
Total Protein: 5 g/dL — ABNORMAL LOW (ref 6.5–8.1)

## 2020-04-19 LAB — CBC
HCT: 34 % — ABNORMAL LOW (ref 36.0–46.0)
Hemoglobin: 10.1 g/dL — ABNORMAL LOW (ref 12.0–15.0)
MCH: 24.5 pg — ABNORMAL LOW (ref 26.0–34.0)
MCHC: 29.7 g/dL — ABNORMAL LOW (ref 30.0–36.0)
MCV: 82.5 fL (ref 80.0–100.0)
Platelets: 622 10*3/uL — ABNORMAL HIGH (ref 150–400)
RBC: 4.12 MIL/uL (ref 3.87–5.11)
RDW: 21.2 % — ABNORMAL HIGH (ref 11.5–15.5)
WBC: 15.3 10*3/uL — ABNORMAL HIGH (ref 4.0–10.5)
nRBC: 0 % (ref 0.0–0.2)

## 2020-04-19 LAB — URINALYSIS, ROUTINE W REFLEX MICROSCOPIC
Bilirubin Urine: NEGATIVE
Glucose, UA: NEGATIVE mg/dL
Hgb urine dipstick: NEGATIVE
Ketones, ur: NEGATIVE mg/dL
Leukocytes,Ua: NEGATIVE
Nitrite: NEGATIVE
Protein, ur: NEGATIVE mg/dL
Specific Gravity, Urine: 1.023 (ref 1.005–1.030)
pH: 5 (ref 5.0–8.0)

## 2020-04-19 LAB — I-STAT BETA HCG BLOOD, ED (MC, WL, AP ONLY): I-stat hCG, quantitative: <5 m[IU]/mL (ref <5)

## 2020-04-19 LAB — LIPASE, BLOOD: Lipase: 14 U/L (ref 11–51)

## 2020-04-19 MED ORDER — SODIUM CHLORIDE 0.9% FLUSH: 3.0000 mL | Freq: Once | INTRAVENOUS | Status: DC

## 2020-04-19 NOTE — ED Notes (Signed)
Pt stated she is going to be seen somewhere else. Advised pt to return if symptoms worsen.

## 2020-04-19 NOTE — ED Triage Notes (Signed)
Patient c/o weight loss of 20 lbs the last 2 weeks. Reported hx of pancreatitis and abdominal pain + NVD.

## 2020-05-06 ENCOUNTER — Emergency Department (HOSPITAL_COMMUNITY): Payer: BC Managed Care – PPO

## 2020-05-06 ENCOUNTER — Encounter (HOSPITAL_COMMUNITY): Payer: Self-pay

## 2020-05-06 ENCOUNTER — Emergency Department (HOSPITAL_COMMUNITY)
Admission: EM | Admit: 2020-05-06 | Discharge: 2020-05-07 | Disposition: A | Discharge status: Home / Self Care | Payer: BC Managed Care – PPO | Attending: Emergency Medicine | Admitting: Emergency Medicine

## 2020-05-06 DIAGNOSIS — R109 Unspecified abdominal pain: Secondary | ICD-10-CM | POA: Insufficient documentation

## 2020-05-06 DIAGNOSIS — R079 Chest pain, unspecified: Secondary | ICD-10-CM | POA: Diagnosis not present

## 2020-05-06 DIAGNOSIS — Z5321 Procedure and treatment not carried out due to patient leaving prior to being seen by health care provider: Secondary | ICD-10-CM | POA: Diagnosis not present

## 2020-05-06 DIAGNOSIS — R600 Localized edema: Secondary | ICD-10-CM | POA: Diagnosis not present

## 2020-05-06 NOTE — ED Triage Notes (Signed)
Pt BIB [facility] for eval of abd pain, chest pain and blt LE edema. EMS reports pt was recently admitted for same at [facility] and dc'd about 2-3 days ago. Hx pancreatitis.

## 2020-05-07 NOTE — ED Notes (Signed)
Pt stated that she couldn't wait to be seen.

## 2020-08-06 DEATH — deceased

## 2020-08-25 IMAGING — US US HEPATIC LIVER DOPPLER
1 series · 14 of 25 positions shown · non-contrast
Comparison: None.

CLINICAL DATA: Crohn's colitis.

EXAM:
DUPLEX ULTRASOUND OF LIVER
TECHNIQUE: Color and duplex Doppler ultrasound was performed to evaluate the
hepatic in-flow and out-flow vessels.

[Series 1: us hepatic liver doppler · 14 of 74 slices shown]
[im 1/74]
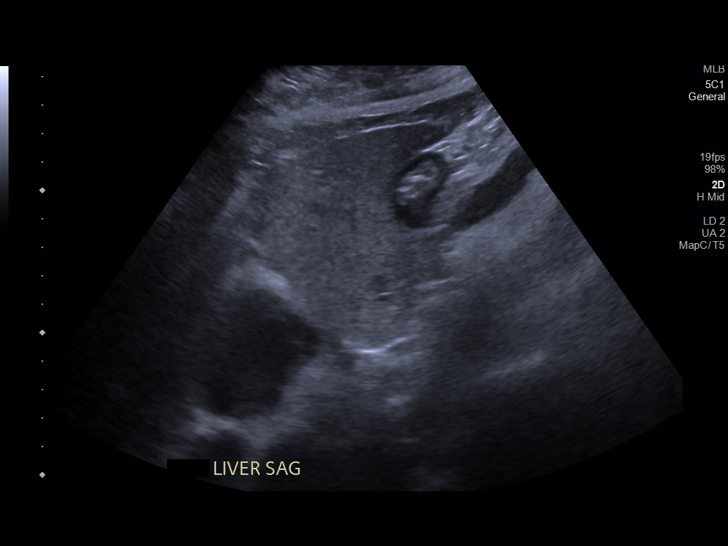
[im 7/74]
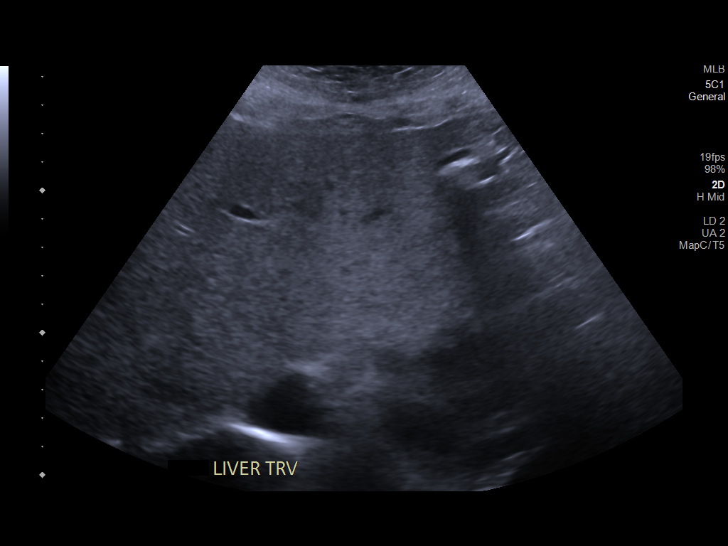
[im 13/74]
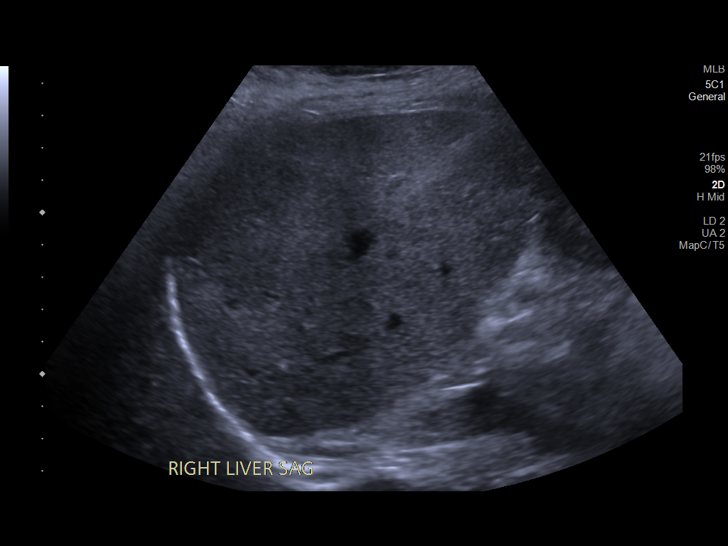
[im 19/74]
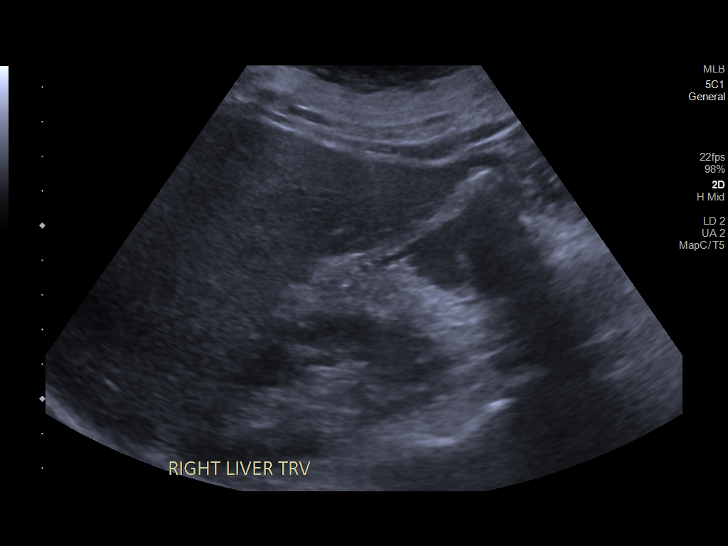
[im 25/74]
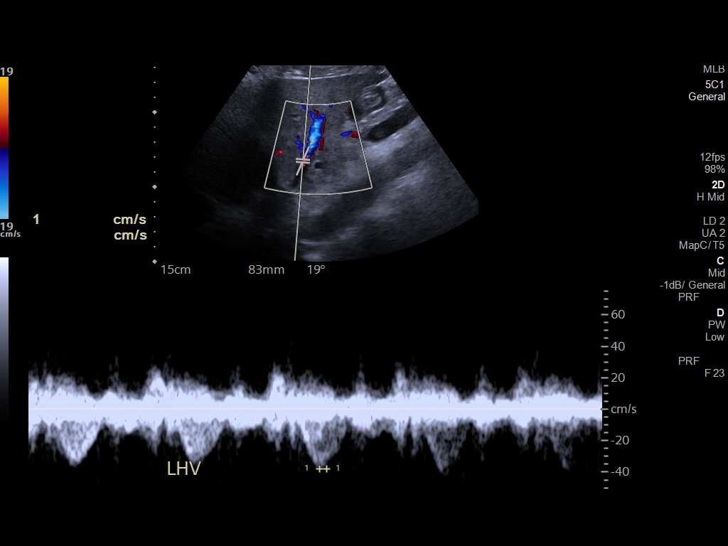
[im 28/74]
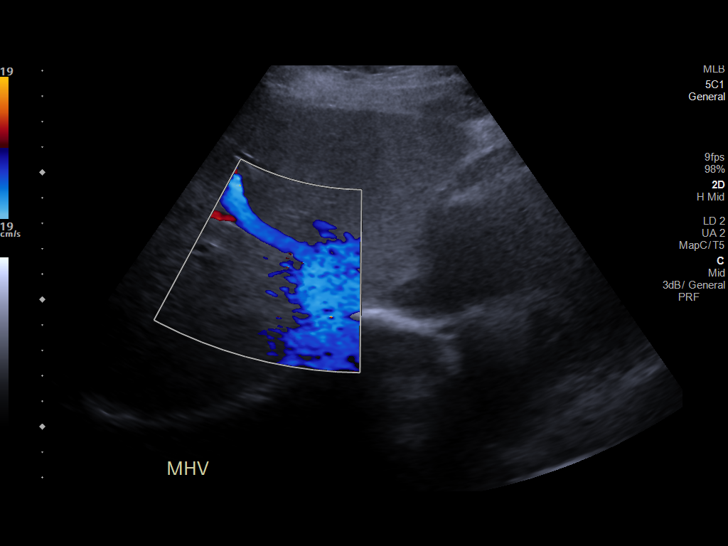
[im 34/74]
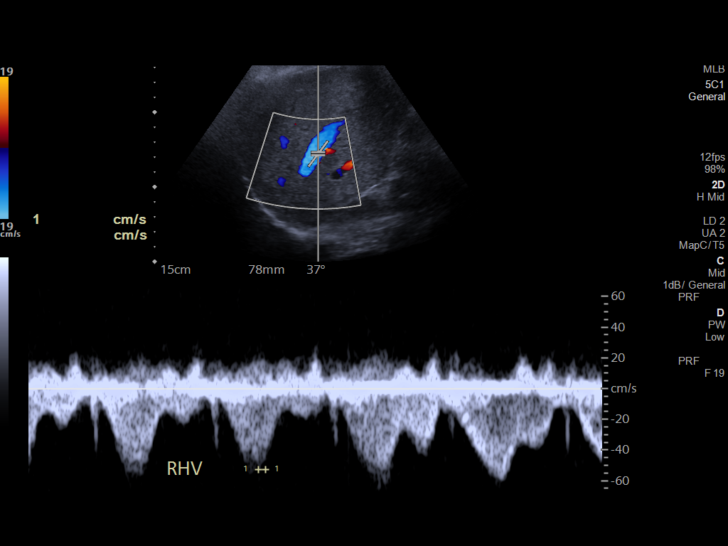
[im 40/74]
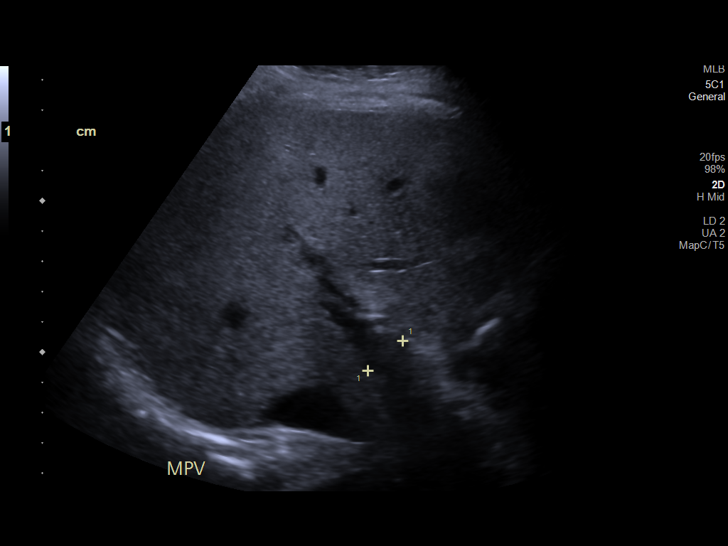
[im 46/74]
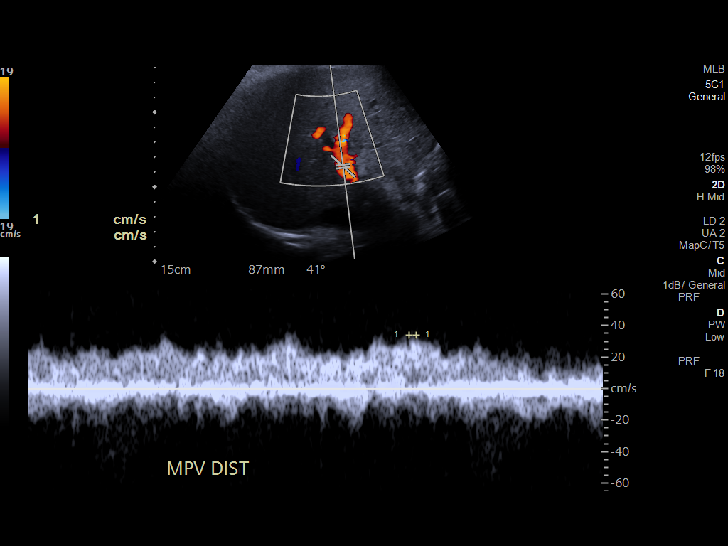
[im 49/74]
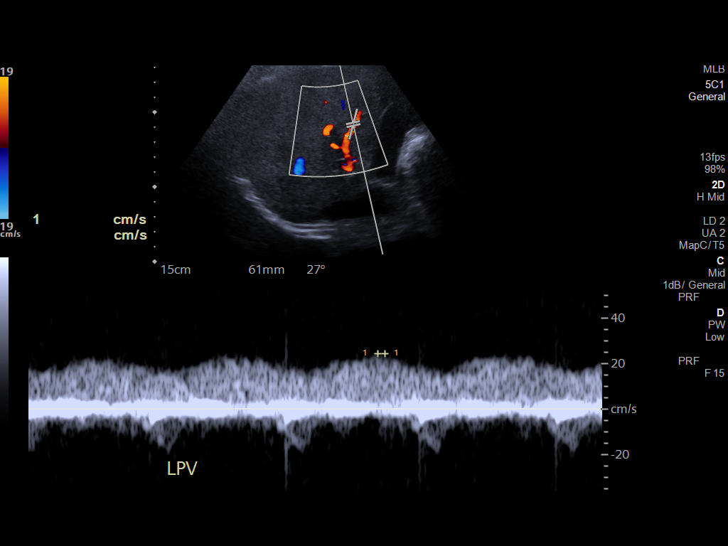
[im 55/74]
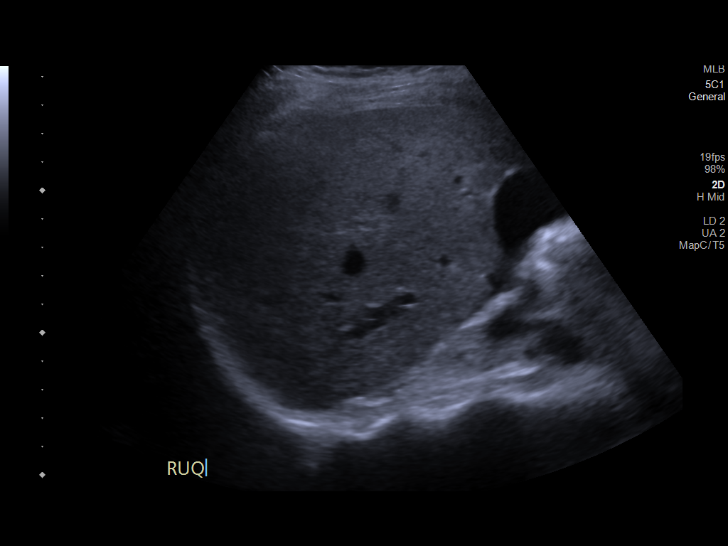
[im 61/74]
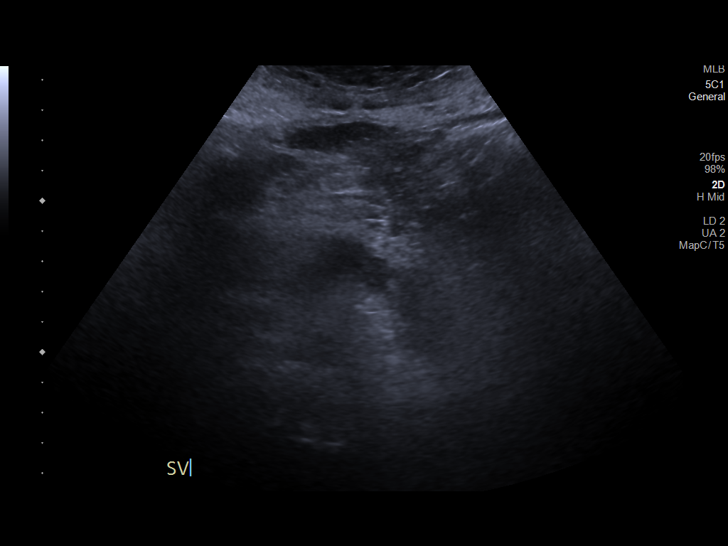
[im 67/74]
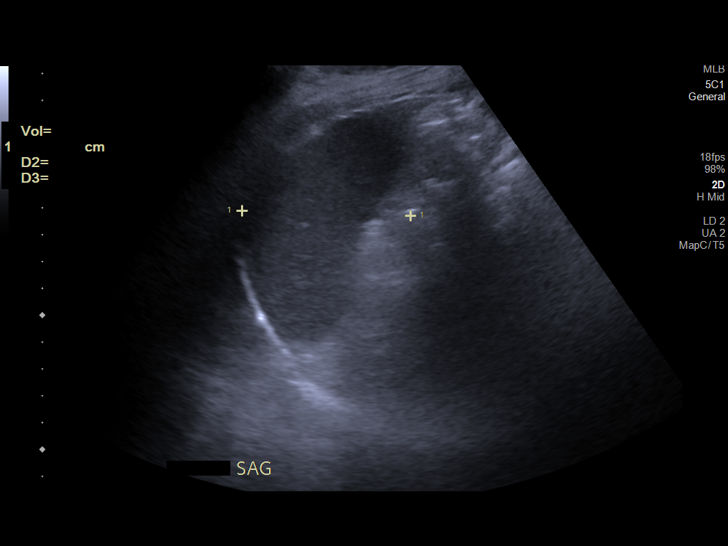
[im 74/74]
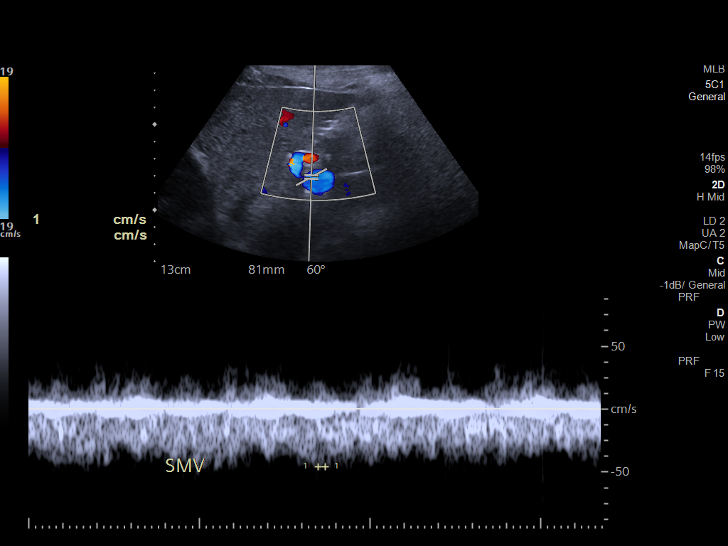

[14 of 25 positions shown; findings below may reference images not displayed]

FINDINGS: Liver: Normal parenchymal echogenicity. Normal hepatic contour
without nodularity.

No focal lesion, mass or intrahepatic biliary ductal dilatation.

Main Portal Vein size: 1.5 cm

Portal Vein Velocities

Main Prox:  31.4 cm/sec

Main Mid: 38.1 cm/sec

Main Dist:  33.9 cm/sec
Right: 41.6 cm/sec
Left: 24.3 cm/sec

Normal hepatopetal flow is noted in the portal veins.

Hepatic Vein Velocities

Right:  62.3 cm/sec

Middle:  31.9 cm/sec

Left:  63.3 cm/sec

Normal hepatofugal flow is noted in the hepatic veins.

IVC: Present and patent with normal respiratory phasicity.

Hepatic Artery Velocity:  61.9 cm/sec

Splenic Vein Velocity:  43.1 cm/sec

Spleen: 6.2 cm x 9.9 cm x 5.1 cm with a total volume of 166 cm^3
(411 cm^3 is upper limit normal)

Portal Vein Occlusion/Thrombus: No

Splenic Vein Occlusion/Thrombus: No

Ascites: None

Varices: None
IMPRESSION: No Doppler evidence of portal, hepatic or splenic venous thrombosis
or occlusion.

## 2021-05-01 IMAGING — DX DG CHEST 2V
2 series · 2 of 2 positions shown · non-contrast
Comparison: None.

CLINICAL DATA: Abdominal pain, chest pain, and lower extremity
edema. Recent admissions at an outside institution for similar
complaints. History of pancreatitis.

EXAM:
CHEST - 2 VIEW

[chest lat]
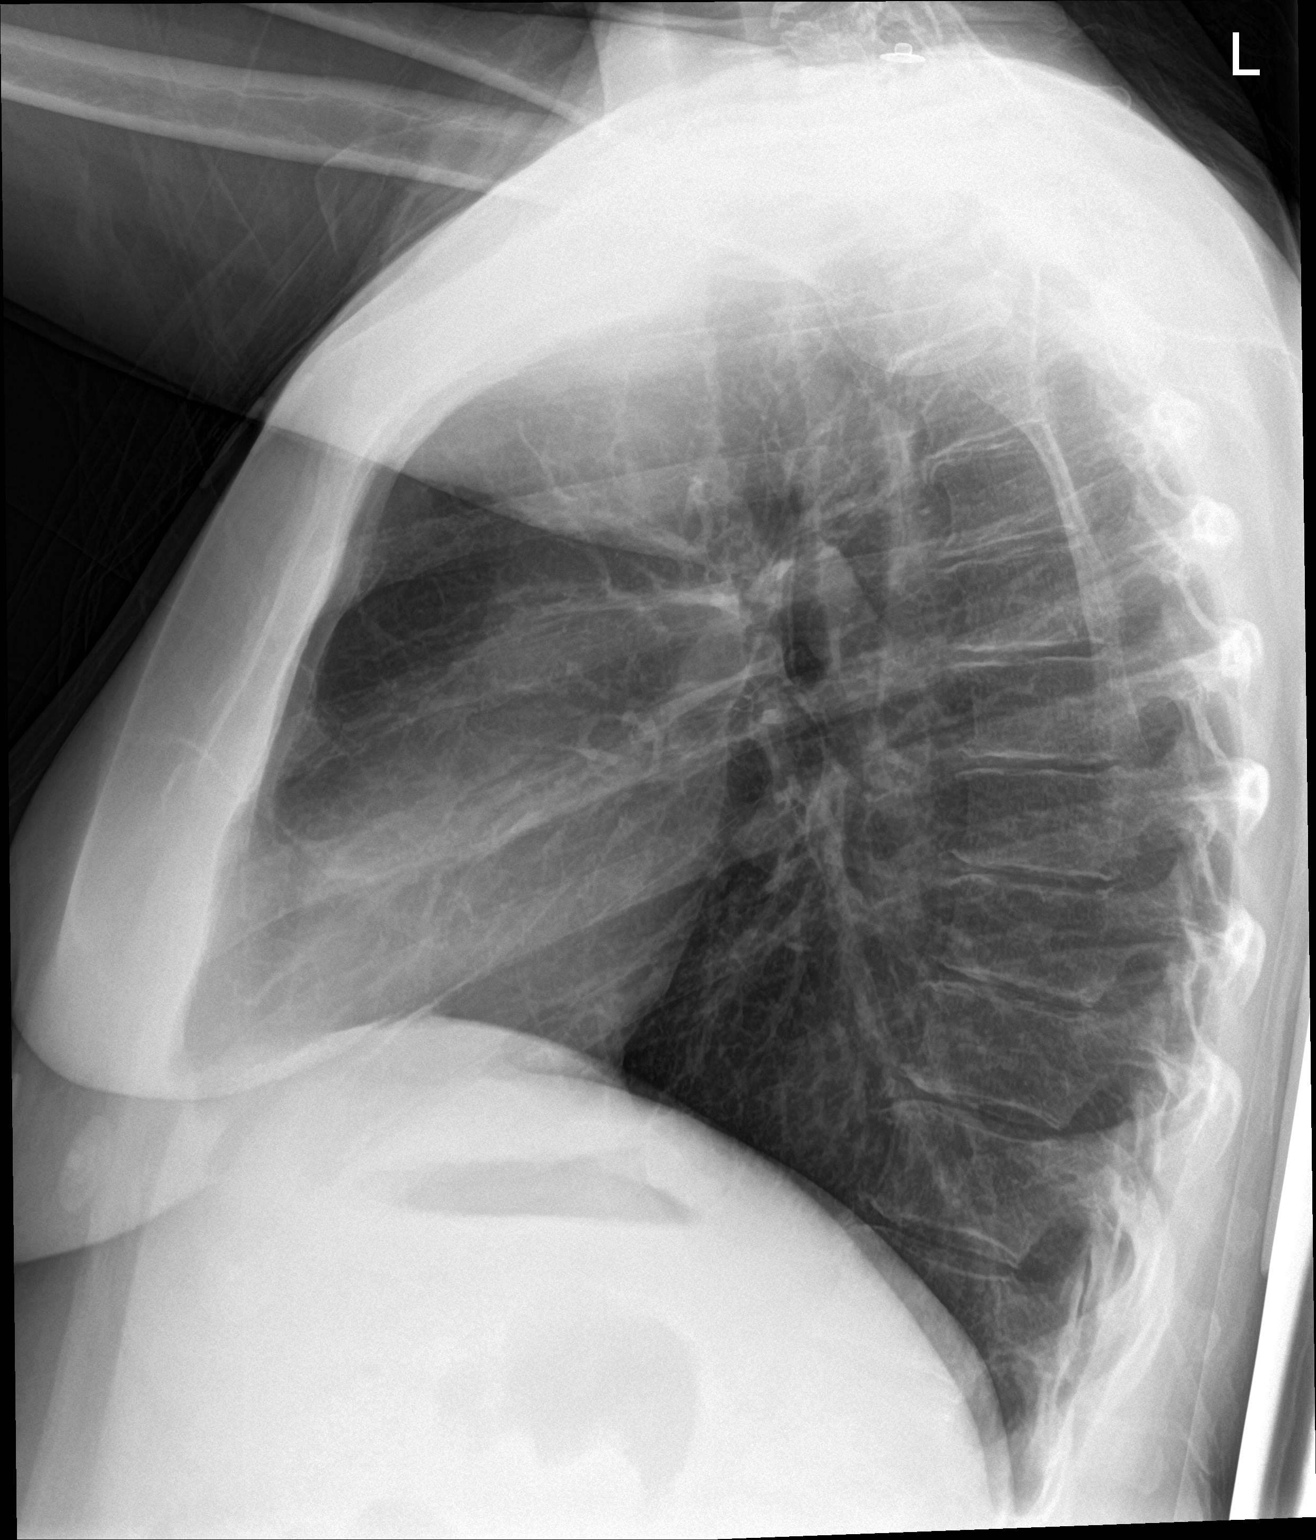

[chest ap]
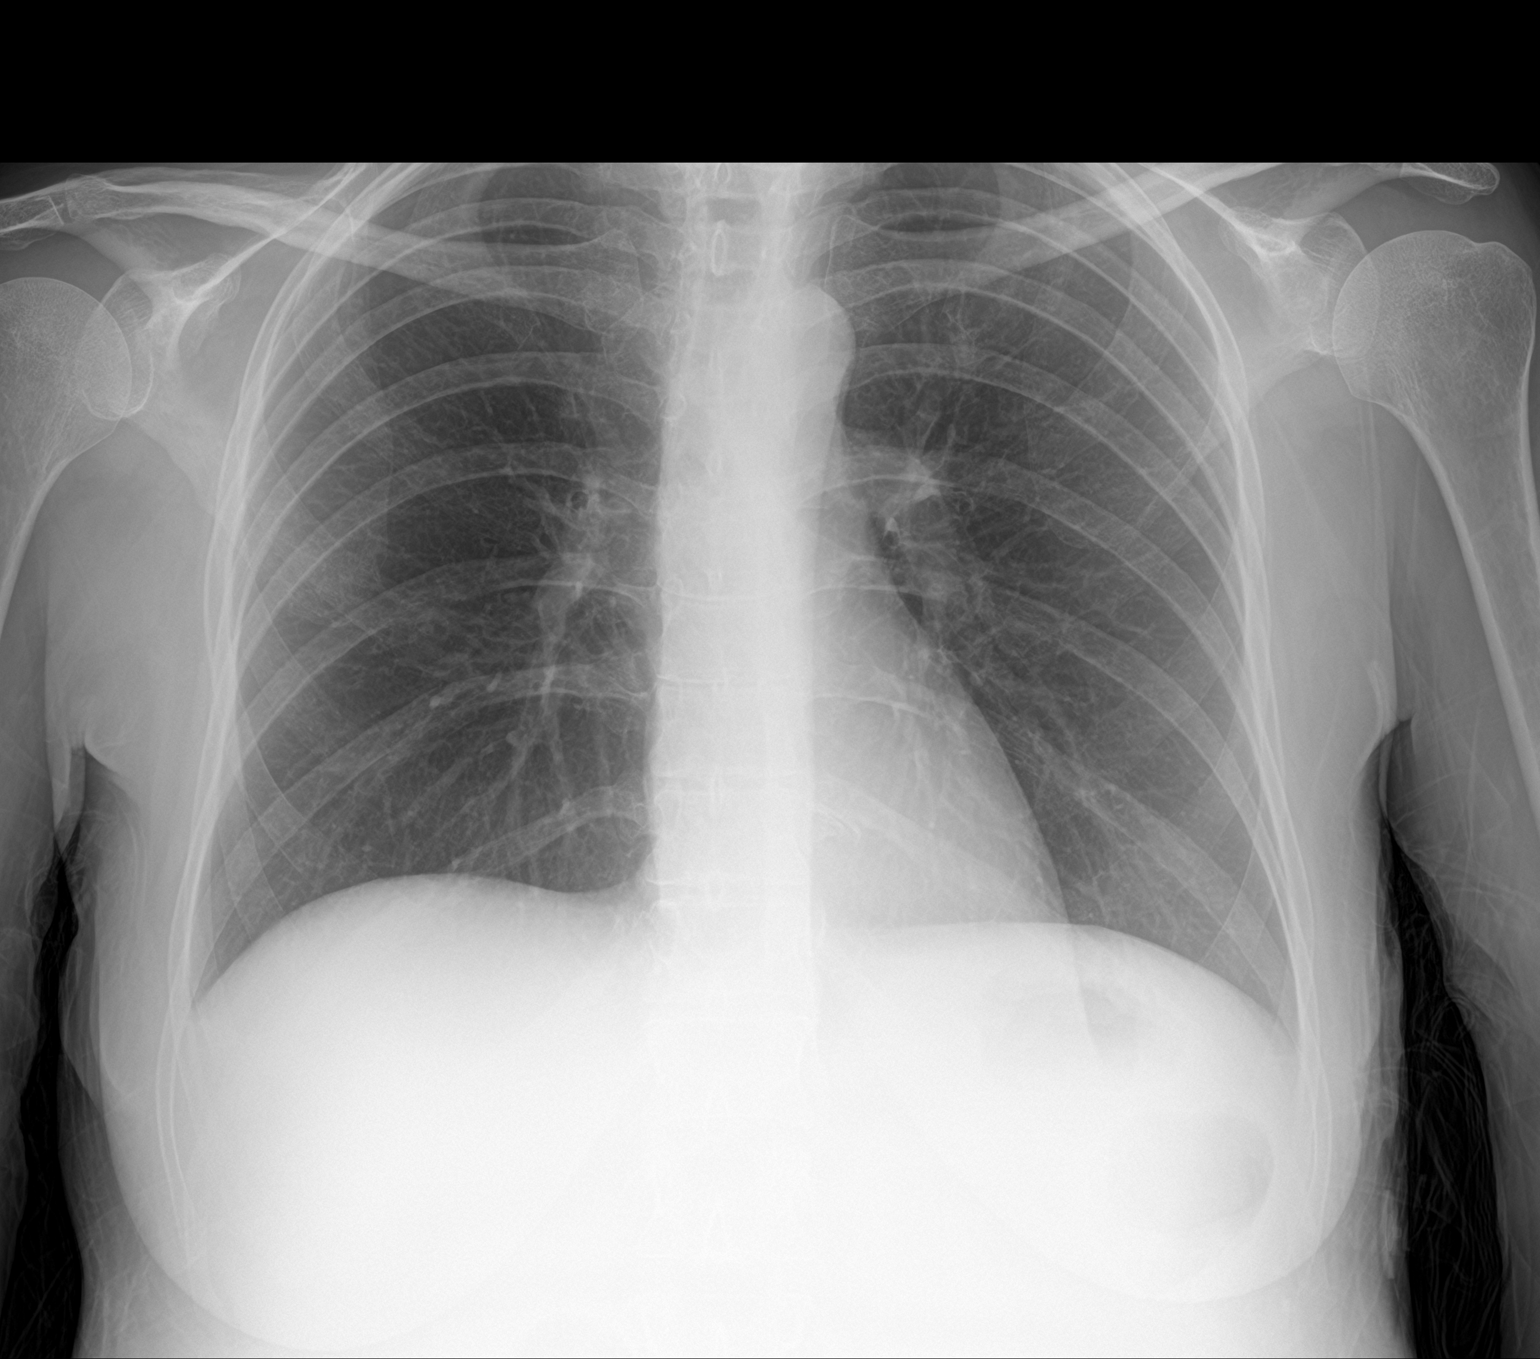

[2 of 2 positions shown; findings below may reference images not displayed]

FINDINGS: The heart size and mediastinal contours are within normal limits.
Both lungs are clear. The visualized skeletal structures are
unremarkable.
IMPRESSION: No active cardiopulmonary disease.
